# Patient Record
Sex: Male | Born: 1945
Health system: Southern US, Community
[De-identification: ages and names within clinical notes are randomized; demographics above are authoritative.]

## PROBLEM LIST (undated history)

## (undated) DIAGNOSIS — K219 Gastro-esophageal reflux disease without esophagitis: Secondary | ICD-10-CM

## (undated) DIAGNOSIS — Z860100 Personal history of colon polyps, unspecified: Secondary | ICD-10-CM

## (undated) DIAGNOSIS — E785 Hyperlipidemia, unspecified: Secondary | ICD-10-CM

## (undated) DIAGNOSIS — I10 Essential (primary) hypertension: Secondary | ICD-10-CM

## (undated) DIAGNOSIS — A048 Other specified bacterial intestinal infections: Secondary | ICD-10-CM

## (undated) HISTORY — DX: Other specified bacterial intestinal infections: A04.8

## (undated) HISTORY — PX: ROTATOR CUFF REPAIR: SHX139

## (undated) HISTORY — PX: COLONOSCOPY: SHX174

## (undated) HISTORY — DX: Personal history of colon polyps, unspecified: Z86.0100

## (undated) HISTORY — DX: Personal history of colonic polyps: Z86.010

## (undated) HISTORY — DX: Hyperlipidemia, unspecified: E78.5

---

## 2002-06-15 ENCOUNTER — Ambulatory Visit (HOSPITAL_BASED_OUTPATIENT_CLINIC_OR_DEPARTMENT_OTHER): Admission: RE | Admit: 2002-06-15 | Discharge: 2002-06-16 | Payer: Self-pay | Admitting: Orthopedic Surgery

## 2003-11-29 DIAGNOSIS — Z860101 Personal history of adenomatous and serrated colon polyps: Secondary | ICD-10-CM | POA: Insufficient documentation

## 2004-02-21 ENCOUNTER — Emergency Department (HOSPITAL_COMMUNITY): Admission: EM | Admit: 2004-02-21 | Discharge: 2004-02-21 | Payer: Self-pay | Admitting: Emergency Medicine

## 2004-10-28 ENCOUNTER — Ambulatory Visit: Payer: Self-pay | Admitting: Internal Medicine

## 2005-06-02 ENCOUNTER — Ambulatory Visit: Payer: Self-pay | Admitting: Internal Medicine

## 2008-04-04 ENCOUNTER — Emergency Department (HOSPITAL_COMMUNITY): Admission: EM | Admit: 2008-04-04 | Discharge: 2008-04-04 | Payer: Self-pay | Admitting: Emergency Medicine

## 2008-11-24 ENCOUNTER — Ambulatory Visit: Payer: Self-pay | Admitting: Gastroenterology

## 2008-12-06 ENCOUNTER — Ambulatory Visit: Payer: Self-pay | Admitting: Gastroenterology

## 2008-12-21 ENCOUNTER — Ambulatory Visit: Payer: Self-pay | Admitting: Internal Medicine

## 2008-12-21 DIAGNOSIS — E785 Hyperlipidemia, unspecified: Secondary | ICD-10-CM

## 2008-12-21 HISTORY — DX: Hyperlipidemia, unspecified: E78.5

## 2008-12-21 LAB — CONVERTED CEMR LAB
Basophils Relative: 0.7 % (ref 0.0–3.0)
Chloride: 105 meq/L (ref 96–112)
Creatinine, Ser: 1 mg/dL (ref 0.4–1.5)
Direct LDL: 181 mg/dL
Eosinophils Relative: 4.2 % (ref 0.0–5.0)
HCT: 47.9 % (ref 39.0–52.0)
Hemoglobin: 16.7 g/dL (ref 13.0–17.0)
MCV: 86.2 fL (ref 78.0–100.0)
Monocytes Absolute: 0.6 10*3/uL (ref 0.1–1.0)
Neutro Abs: 4.1 10*3/uL (ref 1.4–7.7)
Neutrophils Relative %: 66 % (ref 43.0–77.0)
Potassium: 4.2 meq/L (ref 3.5–5.1)
RBC: 5.55 M/uL (ref 4.22–5.81)
Sodium: 140 meq/L (ref 135–145)
Total CHOL/HDL Ratio: 5
VLDL: 18.2 mg/dL (ref 0.0–40.0)
WBC: 6.3 10*3/uL (ref 4.5–10.5)

## 2010-12-13 NOTE — Op Note (Signed)
NAME:  Bradley Thompson, Bradley Thompson                           ACCOUNT NO.:  [id number]   MEDICAL RECORD NO.:  [id number]                   PATIENT TYPE:  AMB   LOCATION:  DSC                                  FACILITY:  [facility]   PHYSICIAN:  Harvie Junior, M.D.                DATE OF BIRTH:  Oct 23, 1945   DATE OF PROCEDURE:  06/15/2002  DATE OF DISCHARGE:                                 OPERATIVE REPORT   PREOPERATIVE DIAGNOSIS:  1. Rotator cuff tear.  2. Impingement.  3. Acromioclavicular joint arthritis.   POSTOPERATIVE DIAGNOSIS:  1. Rotator cuff tear.  2. Impingement.  3. Acromioclavicular joint arthritis.  4. Severely injured biceps tendon with superior labral fray and tear.   OPERATION PERFORMED:  1. Mini open rotator cuff repair with chronic rotator cuff tear with     corresponding acromioplasty.  2. Distal clavicle resection, arthroscopic.  3. Bicipital tenodesis in the biceps groove with suture anchors.   SURGEON:  Harvie Junior, M.D.   ASSISTANT:  Marshia Ly, P.A.   ANESTHESIA:  General.   INDICATIONS FOR PROCEDURE:  The patient is a 65 year old gentleman with a  long history of having a fall at work.  He began complaining of shoulder  pain and weakness.  Because of continued complaints of pain and weakness, he  was seen and noted to have significant weakness.  MRI was obtained which  showed that he had a full thickness retracted rotator cuff tear with some  fraying of the biceps tendon.  He is brought to the operating room for  fixation of rotator cuff tear.   DESCRIPTION OF PROCEDURE:  The patient was brought to the operating room and  after adequate anesthesia was obtained with a general anesthetic, the  patient was placed in beach chair position.  All bony prominences were well  padded.  Attention was turned to the left shoulder where after routine  prepping and draping, arthroscopic examination of the shoulder revealed that  there was significant fray of the biceps  tendon with longitudinal split  tearing and narrowing.  This was debrided within the glenohumeral joint and  the rotator cuff was then identified.  The rotator tear was identified and  noted to be really not retracted all that significantly.  We then debrided  the superior labral tear, debrided the torn portions of the biceps tendon  and then went up into the subacromial space.  At that point, attention was  turned towards the large anterior spur which was debrided from the posterior  portal as well as from the lateral portal.  The distal clavicle was resected  over a span of about 17 mm.  At this point attention was turned to mini open  rotator cuff repair.  The lateral incision was made.  Subcutaneous tissues  dissected down to the level of the raphe between the anterior and middle  deltoid.  This  was explored.  This was opened and the rotator cuff tear was  identified.  The bicipital notch was very stenotic and with sharp spurs in  the notch and at this point it was felt that the biceps tendon because of  its fraying, a longitudinal split tearing was at high risk for failing.  At  this point the biceps tendon was released from the superior portion, contact  portion of the glenoid and the biceps tendon was tagged in the wound.  Attention now returned to creating a trough just right on lateral to the  articular surface and a repair was undertaken with four suture anchors.  One  was in the subscap, two in the supraspinatus and one in the infraspinatus  although the infraspinatus did have a tear in a shape such that tendon could  be repaired to tendon.  The biceps tendon had been released previously and  was also tenodesed through a suture anchor which was used also for rotator  cuff repair and a separate suture anchor which was in the bicipital groove  which had been debrided of all bony prominences and spurring.  Once the  anchoring was achieved and all knots were tied, the arm was put  through a  range of motion.  Excellent range of motion was achieved.  A check was made  of the acromioplasty and excellent acromioplasty had been achieved.  Distal  clavicle resection also.  At this point the shoulder was copiously irrigated  and suctioned dry.  The raphe was closed with one Vicryl running suture and  the skin with 2-0 Vicryl and a 3-0 Maxon pullout suture.  Benzoin and Steri-  Strips were applied.  The patient was taken to the recovery room where she  was noted to be in satisfactory condition.  The estimated blood loss for  this procedure was none.                                                 Harvie Junior, M.D.    Ranae Plumber  D:  06/15/2002  T:  06/15/2002  Job:  [id number]

## 2011-02-26 ENCOUNTER — Encounter: Payer: Self-pay | Admitting: Internal Medicine

## 2011-02-26 ENCOUNTER — Ambulatory Visit (INDEPENDENT_AMBULATORY_CARE_PROVIDER_SITE_OTHER): Payer: BC Managed Care – PPO | Admitting: Internal Medicine

## 2011-02-26 VITALS — BP 114/80 | HR 68 | Temp 98.9°F | Ht 68.0 in | Wt 183.5 lb

## 2011-02-26 DIAGNOSIS — N529 Male erectile dysfunction, unspecified: Secondary | ICD-10-CM

## 2011-02-26 DIAGNOSIS — Z0000 Encounter for general adult medical examination without abnormal findings: Secondary | ICD-10-CM | POA: Insufficient documentation

## 2011-02-26 DIAGNOSIS — R42 Dizziness and giddiness: Secondary | ICD-10-CM

## 2011-02-26 DIAGNOSIS — M6281 Muscle weakness (generalized): Secondary | ICD-10-CM

## 2011-02-26 DIAGNOSIS — L723 Sebaceous cyst: Secondary | ICD-10-CM

## 2011-02-26 DIAGNOSIS — R29898 Other symptoms and signs involving the musculoskeletal system: Secondary | ICD-10-CM

## 2011-02-26 MED ORDER — VARDENAFIL HCL 20 MG PO TABS: 20.0000 mg | ORAL_TABLET | ORAL | Status: AC | PRN

## 2011-02-26 MED ORDER — MECLIZINE HCL 12.5 MG PO TABS: 12.5000 mg | ORAL_TABLET | Freq: Three times a day (TID) | ORAL | Status: AC | PRN

## 2011-02-26 NOTE — Patient Instructions (Signed)
Take all new medications as prescribed Continue all other medications as before You can also take  Mucinex (or it's generic off brand) for congestion  

## 2011-02-26 NOTE — Progress Notes (Signed)
  Subjective:    Patient ID: Bradley Thompson, male    DOB: 08-14-1945, 65 y.o.   MRN: [id number]  HPI here to f/u with acute;  Mentions 2 wks ago was "partying" with ETOH rather large amount over the weekend, but Sunday morning could not get up due to room spinning, worse with position change.  Better initially, but since then has had milder symptoms recurrent with position change, such that he finds it easier to sleep on left side.  No overt HA, ear symtpoms, sinus symtpoms, ST, cough, fever and Pt denies chest pain, increased sob or doe, wheezing, orthopnea, PND, increased LE swelling, palpitations, dizziness or syncope.  Pt denies new neurological symptoms such as new headache, or facial or extremity weakness or numbness, except does mention chronic recurrent approx 1 yr weakness to the LLE only worse with standing for approx 15 min, no pain, no numbness, no back pain. Also with "bump on the back" , chronic peristent, Dr Debby Bud stated no tx needed but pt wants looked at again.   Past Medical History  Diagnosis Date  . HYPERLIPIDEMIA 12/21/2008   Past Surgical History  Procedure Date  . Rotator cuff repair     Dr. Luiz Blare    reports that he has been passively smoking.  He does not have any smokeless tobacco history on file. His alcohol and drug histories not on file. family history is not on file. No Known Allergies No current outpatient prescriptions on file prior to visit.   Review of Systems Review of Systems  Constitutional: Negative for diaphoresis and unexpected weight change.  HENT: Negative for drooling and tinnitus.   Eyes: Negative for photophobia and visual disturbance.  Respiratory: Negative for choking and stridor.   Gastrointestinal: Negative for vomiting and blood in stool.  Genitourinary: Negative for hematuria and decreased urine volume.  Musculoskeletal: Negative for gait problem.  Skin: Negative for color change and wound.  Neurological: Negative for tremors and numbness.    Psychiatric/Behavioral: Negative for decreased concentration. The patient is not hyperactive.       Objective:   Physical Exam BP 114/80  Pulse 68  Temp(Src) 98.9 F (37.2 C) (Oral)  Ht 5\' 8"  (1.727 m)  Wt 183 lb 8 oz (83.235 kg)  BMI 27.90 kg/m2  SpO2 97% Physical Exam  VS noted Constitutional: Pt appears well-developed and well-nourished.  HENT: Head: Normocephalic.  Right Ear: External ear normal.  Left Ear: External ear normal.  Bilat tm's mild erythema.  Sinus nontender.  Pharynx mild erythema Eyes: Conjunctivae and EOM are normal. Pupils are equal, round, and reactive to light.  Neck: Normal range of motion. Neck supple.  Cardiovascular: Normal rate and regular rhythm.   Pulmonary/Chest: Effort normal and breath sounds normal.  Abd:  Soft, NT, non-distended, + BS Neurological: Pt is alert. No cranial nerve deficit. FTN intact, motor/dtr/gait intact Skin: Skin is warm. No erythema. right flank with 1/2 cm noninflamed seb cyst subq Psychiatric: Pt behavior is normal. Thought content normal.         Assessment & Plan:

## 2011-03-06 ENCOUNTER — Encounter: Payer: Self-pay | Admitting: Internal Medicine

## 2011-03-06 ENCOUNTER — Ambulatory Visit (INDEPENDENT_AMBULATORY_CARE_PROVIDER_SITE_OTHER): Payer: BC Managed Care – PPO | Admitting: Internal Medicine

## 2011-03-06 VITALS — BP 138/94 | HR 84 | Temp 98.6°F | Wt 182.0 lb

## 2011-03-06 DIAGNOSIS — Z0000 Encounter for general adult medical examination without abnormal findings: Secondary | ICD-10-CM

## 2011-03-06 DIAGNOSIS — R42 Dizziness and giddiness: Secondary | ICD-10-CM

## 2011-03-06 DIAGNOSIS — E785 Hyperlipidemia, unspecified: Secondary | ICD-10-CM

## 2011-03-06 DIAGNOSIS — Z136 Encounter for screening for cardiovascular disorders: Secondary | ICD-10-CM

## 2011-03-09 ENCOUNTER — Encounter: Payer: Self-pay | Admitting: Internal Medicine

## 2011-03-09 NOTE — Assessment & Plan Note (Signed)
Dr. Jonny Ruiz did a full exam on visit August 1. Lipid labs will be ordered. Patient is current with colorectal cancer screening with last sutdy May '10. Immunizations: last tetanus May '10. 12 lead EKG without signs of ischemia.  In summary- a very nice man who seems to be medically stable. He will be notified of results of lipid panel. He will return as needed.

## 2011-03-09 NOTE — Assessment & Plan Note (Signed)
Patinet with symptoms that are strongly suggestive of labyrinthitis. He is alsready doing better. No evidence of CNS origin.  Plan - time           Meclizine as needed.

## 2011-03-09 NOTE — Assessment & Plan Note (Signed)
NO recent lab work re: lipids.  Plan - lipid panel in the near future

## 2011-03-09 NOTE — Assessment & Plan Note (Signed)
Exam benign, ok to follow, consider MRI ls spine

## 2011-03-09 NOTE — Assessment & Plan Note (Signed)
For levitra prn,  to f/u any worsening symptoms or concerns  

## 2011-03-09 NOTE — Assessment & Plan Note (Signed)
Mild, for antivert prn,  to f/u any worsening symptoms or concerns

## 2011-03-09 NOTE — Progress Notes (Signed)
  Subjective:    Patient ID: Bradley Thompson, male    DOB: 01/01/1946, 65 y.o.   MRN: [id number]  HPI Mr. Justun has not been seen for several years. He recently saw Dr. Jonny Ruiz for dizziness. This had an abrpt on-set and he thought it may be associated with drinking. He had a full exam by Dr. Jonny Ruiz - note reviewed. Today he is doing better but still with minor dizziness.  Past Medical History  Diagnosis Date  . HYPERLIPIDEMIA 12/21/2008   Past Surgical History  Procedure Date  . Rotator cuff repair     Dr. Luiz Blare   No family history on file. History   Social History  . Marital Status: Widowed    Spouse Name: N/A    Number of Children: 2  . Years of Education: N/A   Occupational History  . retired 07, [employer]    Social History Main Topics  . Smoking status: Passive Smoker  . Smokeless tobacco: Never Used  . Alcohol Use: 1.0 oz/week    2 drink(s) per week  . Drug Use: Yes     thc weekends  . Sexually Active: Yes -- Male partner(s)   Other Topics Concern  . Not on file   Social History Narrative   HSG, [employer] - 3 yrs. . married '68 - 20 yrs/widowed; serial monogomy. 2 sons - '72, '82:  Oldest son taught in [place] for several years. He lives [place]; younger son lives in [place]; 1 grandson.. retired '07 - worked [employer]. Remains sexually active - uses barrier protection.       Review of Systems System review is negative for any constitutional, cardiac, pulmonary, GI or neuro symptoms or complaints     Objective:   Physical Exam Vitals reveiwed Gen'l - WNWD AA man in no distress HEENT - normal \\Cor  - 2+ radial, RRR Pul - normal respirations. Neuro - A&O x 3, CN II-XII normal, gait - no ataxia, nl tandem gait.          Assessment & Plan:

## 2011-03-09 NOTE — Assessment & Plan Note (Signed)
D/w pt,  Not inflamed, benign, ok to follow

## 2011-03-11 ENCOUNTER — Other Ambulatory Visit: Payer: Self-pay | Admitting: *Deleted

## 2011-03-11 DIAGNOSIS — E785 Hyperlipidemia, unspecified: Secondary | ICD-10-CM

## 2011-04-30 LAB — RPR: RPR Ser Ql: NONREACTIVE

## 2011-04-30 LAB — GC/CHLAMYDIA PROBE AMP, GENITAL: Chlamydia, DNA Probe: NEGATIVE

## 2013-12-06 ENCOUNTER — Encounter: Payer: Self-pay | Admitting: Internal Medicine

## 2014-02-21 ENCOUNTER — Encounter: Payer: Self-pay | Admitting: Internal Medicine

## 2014-03-16 ENCOUNTER — Ambulatory Visit (INDEPENDENT_AMBULATORY_CARE_PROVIDER_SITE_OTHER): Payer: Medicare Other | Admitting: Family Medicine

## 2014-03-16 ENCOUNTER — Encounter: Payer: Self-pay | Admitting: Family Medicine

## 2014-03-16 VITALS — BP 122/84 | HR 82 | Ht 68.0 in | Wt 174.0 lb

## 2014-03-16 DIAGNOSIS — S86819A Strain of other muscle(s) and tendon(s) at lower leg level, unspecified leg, initial encounter: Secondary | ICD-10-CM | POA: Insufficient documentation

## 2014-03-16 DIAGNOSIS — S86812A Strain of other muscle(s) and tendon(s) at lower leg level, left leg, initial encounter: Secondary | ICD-10-CM

## 2014-03-16 DIAGNOSIS — D179 Benign lipomatous neoplasm, unspecified: Secondary | ICD-10-CM

## 2014-03-16 DIAGNOSIS — S838X9A Sprain of other specified parts of unspecified knee, initial encounter: Secondary | ICD-10-CM

## 2014-03-16 NOTE — Progress Notes (Signed)
  [facility] [place],  [address] Phone: [phone] Subjective:     CC: Knots on back  BSW:[id number] Bradley Thompson is a 68 y.o. male coming in with complaint of knots on his back. Patient states that he started over the course years but seems to be getting a little bit larger. Patient states that they're nontender and they are freely movable. Patient states that only when people Hodgkin disease Notice. Patient states that he sleeps comfortably at night it does not wake up from any pressure. Denies any radiation of pain or any numbness. Denies any fevers or chills or any abnormal weight loss. Patient also states that he has left calf pain with walking. This is an insidious onset as well with no true injury. Hurts more on the lateral aspect and after walking for a long amount of time. Denies any radiation of pain, any numbness or weakness. Patient states this has not stopped any of his activities in brace the pain is 4/10.     Past medical history, social, surgical and family history all reviewed in electronic medical record.   Review of Systems: No headache, visual changes, nausea, vomiting, diarrhea, constipation, dizziness, abdominal pain, skin rash, fevers, chills, night sweats, weight loss, swollen lymph nodes, body aches, joint swelling, muscle aches, chest pain, shortness of breath, mood changes.   Objective Blood pressure 122/84, pulse 82, height 5\' 8"  (1.727 m), weight 174 lb (78.926 kg), SpO2 97.00%.  General: No apparent distress alert and oriented x3 mood and affect normal, dressed appropriately.  HEENT: Pupils equal, extraocular movements intact  Respiratory: Patient's speak in full sentences and does not appear short of breath  Cardiovascular: No lower extremity edema, non tender, no erythema  Skin: Warm dry intact with no signs of infection or rash on extremities or on axial skeleton.  Abdomen: Soft nontender  Neuro: Cranial  nerves II through XII are intact, neurovascularly intact in all extremities with 2+ DTRs and 2+ pulses.  Lymph: No lymphadenopathy of posterior or anterior cervical chain or axillae bilaterally.  Gait normal with good balance and coordination.  MSK:  Non tender with full range of motion and good stability and symmetric strength and tone of shoulders, elbows, wrist, hip, knee and ankles bilaterally.  Back exam shows the patient does have 2 lipoma one approximately T7 on the right side as well as one at approximately L1 on the right side. These are both benign and freely movable. Nontender on exam. No skin breakdown noted. No signs of infection.  Limited musculoskeletal ultrasound of the notch shows that this is fatty tissue that is noted. No increasing Doppler flow noted.  Impression: Likely lipoma versus sebaceous cyst with no vascularization    Impression and Recommendations:     This case required medical decision making of moderate complexity.

## 2014-03-16 NOTE — Assessment & Plan Note (Signed)
Patient does have a very mild calf strain. Patient was given home exercise program, and discussed compression sleeve and as well as icing. Patient will try these interventions and come back and see me again in 3-4 weeks if not completely resolved

## 2014-03-16 NOTE — Patient Instructions (Signed)
Good to meet you You do have 2 lipomas on your back.  For your calf try the exercises Consider compression sleeves on the calfs with walking.  Come back again if you need me.  Lipoma A lipoma is a noncancerous (benign) tumor composed of fat cells. They are usually found under the skin (subcutaneous). A lipoma may occur in any tissue of the body that contains fat. Common areas for lipomas to appear include the back, shoulders, buttocks, and thighs. Lipomas are a very common soft tissue growth. They are soft and grow slowly. Most problems caused by a lipoma depend on where it is growing. DIAGNOSIS  A lipoma can be diagnosed with a physical exam. These tumors rarely become cancerous, but radiographic studies can help determine this for certain. Studies used may include:  Computerized X-ray scans (CT or CAT scan).  Computerized magnetic scans (MRI). TREATMENT  Small lipomas that are not causing problems may be watched. If a lipoma continues to enlarge or causes problems, removal is often the best treatment. Lipomas can also be removed to improve appearance. Surgery is done to remove the fatty cells and the surrounding capsule. Most often, this is done with medicine that numbs the area (local anesthetic). The removed tissue is examined under a microscope to make sure it is not cancerous. Keep all follow-up appointments with your caregiver. SEEK MEDICAL CARE IF:   The lipoma becomes larger or hard.  The lipoma becomes painful, red, or increasingly swollen. These could be signs of infection or a more serious condition. Document Released: 07/04/2002 Document Revised: 10/06/2011 Document Reviewed: 12/14/2009 [facility] Patient Information 2015 [place], [place]. This information is not intended to replace advice given to you by your health care provider. Make sure you discuss any questions you have with your health care provider.

## 2014-03-16 NOTE — Assessment & Plan Note (Signed)
We discussed with patient and her lipoma is likely the most differential. Could also be a sebaceous cyst. This is nontender and patient has no systemic findings to make this of any concern. Discuss the patient to have a clear diagnosis he would need to have biopsy which patient declined. Patient will continue to monitor closely in followup if any changes occur.

## 2014-04-18 ENCOUNTER — Ambulatory Visit (AMBULATORY_SURGERY_CENTER): Payer: Self-pay

## 2014-04-18 VITALS — Ht 67.0 in | Wt 177.4 lb

## 2014-04-18 DIAGNOSIS — Z86010 Personal history of colonic polyps: Secondary | ICD-10-CM

## 2014-04-18 NOTE — Progress Notes (Signed)
Per pt, no allergies to soy or egg products.Pt not taking any weight loss meds or using  O2 at home. 

## 2014-04-24 ENCOUNTER — Encounter: Payer: Self-pay | Admitting: Internal Medicine

## 2014-05-02 ENCOUNTER — Ambulatory Visit (AMBULATORY_SURGERY_CENTER): Payer: Medicare Other | Admitting: Internal Medicine

## 2014-05-02 ENCOUNTER — Encounter: Payer: Self-pay | Admitting: Internal Medicine

## 2014-05-02 VITALS — BP 106/67 | HR 56 | Temp 97.0°F | Resp 20 | Ht 67.0 in | Wt 177.0 lb

## 2014-05-02 DIAGNOSIS — D129 Benign neoplasm of anus and anal canal: Secondary | ICD-10-CM

## 2014-05-02 DIAGNOSIS — D120 Benign neoplasm of cecum: Secondary | ICD-10-CM

## 2014-05-02 DIAGNOSIS — D128 Benign neoplasm of rectum: Secondary | ICD-10-CM

## 2014-05-02 DIAGNOSIS — D125 Benign neoplasm of sigmoid colon: Secondary | ICD-10-CM

## 2014-05-02 DIAGNOSIS — D123 Benign neoplasm of transverse colon: Secondary | ICD-10-CM

## 2014-05-02 DIAGNOSIS — Z86010 Personal history of colonic polyps: Secondary | ICD-10-CM

## 2014-05-02 MED ORDER — SODIUM CHLORIDE 0.9 % IV SOLN: 500.0000 mL | INTRAVENOUS | Status: DC

## 2014-05-02 NOTE — Progress Notes (Signed)
Procedure ends, to recovery, report given and VSS. 

## 2014-05-02 NOTE — Progress Notes (Signed)
Called to room to assist during endoscopic procedure.  Patient ID and intended procedure confirmed with present staff. Received instructions for my participation in the procedure from the performing physician.  

## 2014-05-02 NOTE — Patient Instructions (Addendum)
I found and removed 5 polyps today - all look benign.  I will let you know pathology results and when to have another routine colonoscopy by mail.  I appreciate the opportunity to care for you. Gatha Mayer, MD, FACG YOU HAD AN ENDOSCOPIC PROCEDURE TODAY AT [facility]: Refer to the procedure report that was given to you for any specific questions about what was found during the examination.  If the procedure report does not answer your questions, please call your gastroenterologist to clarify.  If you requested that your care partner not be given the details of your procedure findings, then the procedure report has been included in a sealed envelope for you to review at your convenience later.  YOU SHOULD EXPECT: Some feelings of bloating in the abdomen. Passage of more gas than usual.  Walking can help get rid of the air that was put into your GI tract during the procedure and reduce the bloating. If you had a lower endoscopy (such as a colonoscopy or flexible sigmoidoscopy) you may notice spotting of blood in your stool or on the toilet paper. If you underwent a bowel prep for your procedure, then you may not have a normal bowel movement for a few days.  DIET: Your first meal following the procedure should be a light meal and then it is ok to progress to your normal diet.  A half-sandwich or bowl of soup is an example of a good first meal.  Heavy or fried foods are harder to digest and may make you feel nauseous or bloated.  Likewise meals heavy in dairy and vegetables can cause extra gas to form and this can also increase the bloating.  Drink plenty of fluids but you should avoid alcoholic beverages for 24 hours.  ACTIVITY: Your care partner should take you home directly after the procedure.  You should plan to take it easy, moving slowly for the rest of the day.  You can resume normal activity the day after the procedure however you should NOT DRIVE or use heavy  machinery for 24 hours (because of the sedation medicines used during the test).    SYMPTOMS TO REPORT IMMEDIATELY: A gastroenterologist can be reached at any hour.  During normal business hours, 8:30 AM to 5:00 PM Monday through Friday, call [phone].  After hours and on weekends, please call the GI answering service at [phone] who will take a message and have the physician on call contact you.   Following lower endoscopy (colonoscopy or flexible sigmoidoscopy):  Excessive amounts of blood in the stool  Significant tenderness or worsening of abdominal pains  Swelling of the abdomen that is new, acute  Fever of 100F or higher  FOLLOW UP: If any biopsies were taken you will be contacted by phone or by letter within the next 1-3 weeks.  Call your gastroenterologist if you have not heard about the biopsies in 3 weeks.  Our staff will call the home number listed on your records the next business day following your procedure to check on you and address any questions or concerns that you may have at that time regarding the information given to you following your procedure. This is a courtesy call and so if there is no answer at the home number and we have not heard from you through the emergency physician on call, we will assume that you have returned to your regular daily activities without incident.  SIGNATURES/CONFIDENTIALITY: You and/or your care partner  have signed paperwork which will be entered into your electronic medical record.  These signatures attest to the fact that that the information above on your After Visit Summary has been reviewed and is understood.  Full responsibility of the confidentiality of this discharge information lies with you and/or your care-partner.  Recommendations No aspirin, aspirin products, or NSAIDS for 2 weeks.  Next colonoscopy determined by pathology results. Polyp handout/education material provided to patient.

## 2014-05-02 NOTE — Op Note (Signed)
[place]  [address] [place], [address]   COLONOSCOPY PROCEDURE REPORT  PATIENT: Bradley, Thompson  MR#: [id number] BIRTHDATE: August 29, 1945 , 28  yrs. old GENDER: male ENDOSCOPIST: Gatha Mayer, MD, [facility] PROCEDURE DATE:  05/02/2014 PROCEDURE:   Colonoscopy with cold biopsy polypectomy and Colonoscopy with snare polypectomy First Screening Colonoscopy - Avg.  risk and is 50 yrs.  old or older - No.  Prior Negative Screening - Now for repeat screening. N/A ASA CLASS:   Class II INDICATIONS:surveillance colonoscopy based on a history of adenomatous colonic polyp(s). MEDICATIONS: Propofol 130 mg IV  DESCRIPTION OF PROCEDURE:   After the risks benefits and alternatives of the procedure were thoroughly explained, informed consent was obtained.  The digital rectal exam revealed no abnormalities of the rectum.   The LB [id number]  endoscope was introduced through the anus and advanced to the cecum, which was identified by both the appendix and ileocecal valve. No adverse events experienced.   The quality of the prep was excellent, using MiraLax  The instrument was then slowly withdrawn as the colon was fully examined.      COLON FINDINGS: Two sessile polyps ranging from 2 to 24mm in size were found in the transverse colon and at the cecum.  A polypectomy was performed.  The resection was complete, the polyp tissue was completely retrieved and sent to histology.   Three sessile polyps ranging from 4 to 69mm in size were found in the rectum and sigmoid colon.  Polypectomies were performed using snare cautery and with a cold snare.  The resection was complete, the polyp tissue was completely retrieved and sent to histology.   The examination was otherwise normal.   Right colon retroflexion included.  Retroflexed views revealed no abnormalities. The time to cecum=3 minutes 28 seconds.  Withdrawal time=13 minutes 44 seconds.  The scope was withdrawn and the  procedure completed. COMPLICATIONS: There were no immediate complications.    ENDOSCOPIC IMPRESSION: 1.   Two sessile polyps ranging from 2 to 31mm in size were found in the transverse colon and at the cecum; polypectomy was performed 2.   Three sessile polyps ranging from 4 to 39mm in size were found in the rectum and sigmoid colon; polypectomies were performed using snare cautery and with a cold snare 3.   The examination was otherwise normal - hx adenomas since 2005  RECOMMENDATIONS: 1.  Hold Aspirin and all other NSAIDS for 2 weeks. 2.  Timing of repeat colonoscopy will be determined by pathology findings.  eSigned:  Gatha Mayer, MD, [facility] 05/02/2014 9:40 AM   cc: Jannette Fogo, MD and The Patient   PATIENT NAME:  Bradley, Thompson MR#: [id number]

## 2014-05-03 ENCOUNTER — Telehealth: Payer: Self-pay

## 2014-05-03 NOTE — Telephone Encounter (Signed)
Left a message at [phone] for the pt to call us back if he has any questions or concerns. maw

## 2014-05-10 ENCOUNTER — Encounter: Payer: Self-pay | Admitting: Internal Medicine

## 2014-05-10 NOTE — Progress Notes (Signed)
Quick Note:  4 adenomas max 10 mm Repeat colon 2018 ______

## 2014-06-09 ENCOUNTER — Other Ambulatory Visit (INDEPENDENT_AMBULATORY_CARE_PROVIDER_SITE_OTHER): Payer: Medicare Other

## 2014-06-09 ENCOUNTER — Ambulatory Visit (INDEPENDENT_AMBULATORY_CARE_PROVIDER_SITE_OTHER): Payer: Medicare Other | Admitting: Internal Medicine

## 2014-06-09 ENCOUNTER — Encounter: Payer: Self-pay | Admitting: Internal Medicine

## 2014-06-09 VITALS — BP 138/84 | HR 65 | Temp 98.5°F | Resp 18 | Ht 68.0 in | Wt 176.8 lb

## 2014-06-09 DIAGNOSIS — E785 Hyperlipidemia, unspecified: Secondary | ICD-10-CM

## 2014-06-09 DIAGNOSIS — I10 Essential (primary) hypertension: Secondary | ICD-10-CM

## 2014-06-09 DIAGNOSIS — D179 Benign lipomatous neoplasm, unspecified: Secondary | ICD-10-CM

## 2014-06-09 DIAGNOSIS — Z0000 Encounter for general adult medical examination without abnormal findings: Secondary | ICD-10-CM

## 2014-06-09 LAB — LIPID PANEL
CHOL/HDL RATIO: 5
Cholesterol: 251 mg/dL — ABNORMAL HIGH (ref 0–200)
HDL: 48.6 mg/dL (ref >39.00)
LDL CALC: 182 mg/dL — AB (ref 0–99)
NONHDL: 202.4
TRIGLYCERIDES: 102 mg/dL (ref 0.0–149.0)
VLDL: 20.4 mg/dL (ref 0.0–40.0)

## 2014-06-09 LAB — BASIC METABOLIC PANEL
BUN: 10 mg/dL (ref 6–23)
CHLORIDE: 104 meq/L (ref 96–112)
CO2: 21 mEq/L (ref 19–32)
Calcium: 9.4 mg/dL (ref 8.4–10.5)
Creatinine, Ser: 1 mg/dL (ref 0.4–1.5)
GFR: 99.25 mL/min (ref >60.00)
Glucose, Bld: 96 mg/dL (ref 70–99)
Potassium: 3.8 mEq/L (ref 3.5–5.1)
Sodium: 138 mEq/L (ref 135–145)

## 2014-06-09 NOTE — Assessment & Plan Note (Signed)
2 lipomas on the back. We'll continue to monitor yearly for growth. Benign in appearance.

## 2014-06-09 NOTE — Progress Notes (Signed)
   Subjective:    Patient ID: Bradley Thompson, male    DOB: Jan 24, 1946, 68 y.o.   MRN: [id number]  HPI The patient is a 68 year old man who comes in today to establish care. His past medical history of high cholesterol. He is taking supplements and no medications for the cholesterol right now. He tries to exercise several times weekly however now his family is in town and they're keeping him very busy is not been able to do his regular exercise. He denies any chest pains, shortness of breath, abdominal pain, acid reflux, joint pain, recent falls, dizziness.  Here for medicare wellness  Diet: heart healthy or DM if diabetic Physical activity: sedentary Depression/mood screen: negative Hearing: intact to whispered voice Visual acuity: grossly normal, performs annual eye exam  ADLs: capable Fall risk: none Home safety: good Cognitive evaluation: intact to orientation, naming, recall and repetition EOL planning: adv directives, full code/ I agree  I have personally reviewed and have noted 1. The patient's medical and social history 2. Their use of alcohol, tobacco or illicit drugs 3. Their current medications and supplements 4. The patient's functional ability including ADL's, fall risks, home safety risks and hearing or visual impairment. 5. Diet and physical activities 6. Evidence for depression or mood disorders 7. Updated list of providers.  Review of Systems  Constitutional: Negative for fever, activity change, appetite change, fatigue and unexpected weight change.  HENT: Negative.   Respiratory: Negative for cough, chest tightness, shortness of breath and wheezing.   Cardiovascular: Negative for chest pain, palpitations and leg swelling.  Gastrointestinal: Negative for abdominal pain, diarrhea, constipation and abdominal distention.  Genitourinary: Negative.   Musculoskeletal: Negative.   Skin: Negative.   Neurological: Negative.   Psychiatric/Behavioral: Negative.         Objective:   Physical Exam  Constitutional: He is oriented to person, place, and time. He appears well-developed and well-nourished. No distress.  HENT:  Head: Normocephalic and atraumatic.  Eyes: EOM are normal.  Neck: Normal range of motion.  Cardiovascular: Normal rate and regular rhythm.   Pulmonary/Chest: Effort normal and breath sounds normal. No respiratory distress. He has no wheezes. He has no rales.  Abdominal: Soft. Bowel sounds are normal.  Musculoskeletal:  2 lipomas on his back. Benign in appearance.  Neurological: He is alert and oriented to person, place, and time. Coordination normal.  Skin: Skin is warm and dry.   Filed Vitals:   06/09/14 1303  BP: 138/84  Pulse: 65  Temp: 98.5 F (36.9 C)  TempSrc: Oral  Resp: 18  Height: 5\' 8"  (1.727 m)  Weight: 176 lb 12.8 oz (80.196 kg)  SpO2: 98%      Assessment & Plan:

## 2014-06-09 NOTE — Assessment & Plan Note (Signed)
Patient declines any kind of injections including flu shot, pneumonia shot, tetanus shot, shingles shot. He is due for repeat colonoscopy in 2018.

## 2014-06-09 NOTE — Progress Notes (Signed)
Pre visit review using our clinic review tool, if applicable. No additional management support is needed unless otherwise documented below in the visit note. 

## 2014-06-09 NOTE — Patient Instructions (Signed)
You are doing great and are very healthy.  We will check your blood work today and call you back with the results.  Common back next year for a physical. Please feel free to call our office if you have any problems or questions before your next visit.  Health Maintenance A healthy lifestyle and preventative care can promote health and wellness.  Maintain regular health, dental, and eye exams.  Eat a healthy diet. Foods like vegetables, fruits, whole grains, low-fat dairy products, and lean protein foods contain the nutrients you need and are low in calories. Decrease your intake of foods high in solid fats, added sugars, and salt. Get information about a proper diet from your health care provider, if necessary.  Regular physical exercise is one of the most important things you can do for your health. Most adults should get at least 150 minutes of moderate-intensity exercise (any activity that increases your heart rate and causes you to sweat) each week. In addition, most adults need muscle-strengthening exercises on 2 or more days a week.   Maintain a healthy weight. The body mass index (BMI) is a screening tool to identify possible weight problems. It provides an estimate of body fat based on height and weight. Your health care provider can find your BMI and can help you achieve or maintain a healthy weight. For males 20 years and older:  A BMI below 18.5 is considered underweight.  A BMI of 18.5 to 24.9 is normal.  A BMI of 25 to 29.9 is considered overweight.  A BMI of 30 and above is considered obese.  Maintain normal blood lipids and cholesterol by exercising and minimizing your intake of saturated fat. Eat a balanced diet with plenty of fruits and vegetables. Blood tests for lipids and cholesterol should begin at age 22 and be repeated every 5 years. If your lipid or cholesterol levels are high, you are over age 34, or you are at high risk for heart disease, you may need your  cholesterol levels checked more frequently.Ongoing high lipid and cholesterol levels should be treated with medicines if diet and exercise are not working.  If you smoke, find out from your health care provider how to quit. If you do not use tobacco, do not start.  Lung cancer screening is recommended for adults aged 43-80 years who are at high risk for developing lung cancer because of a history of smoking. A yearly low-dose CT scan of the lungs is recommended for people who have at least a 30-pack-year history of smoking and are current smokers or have quit within the past 15 years. A pack year of smoking is smoking an average of 1 pack of cigarettes a day for 1 year (for example, a 30-pack-year history of smoking could mean smoking 1 pack a day for 30 years or 2 packs a day for 15 years). Yearly screening should continue until the smoker has stopped smoking for at least 15 years. Yearly screening should be stopped for people who develop a health problem that would prevent them from having lung cancer treatment.  If you choose to drink alcohol, do not have more than 2 drinks per day. One drink is considered to be 12 oz (360 mL) of beer, 5 oz (150 mL) of wine, or 1.5 oz (45 mL) of liquor.  Avoid the use of street drugs. Do not share needles with anyone. Ask for help if you need support or instructions about stopping the use of drugs.  High blood  pressure causes heart disease and increases the risk of stroke. Blood pressure should be checked at least every 1-2 years. Ongoing high blood pressure should be treated with medicines if weight loss and exercise are not effective.  If you are 38-65 years old, ask your health care provider if you should take aspirin to prevent heart disease.  Diabetes screening involves taking a blood sample to check your fasting blood sugar level. This should be done once every 3 years after age 7 if you are at a normal weight and without risk factors for diabetes. Testing  should be considered at a younger age or be carried out more frequently if you are overweight and have at least 1 risk factor for diabetes.  Colorectal cancer can be detected and often prevented. Most routine colorectal cancer screening begins at the age of 20 and continues through age 77. However, your health care provider may recommend screening at an earlier age if you have risk factors for colon cancer. On a yearly basis, your health care provider may provide home test kits to check for hidden blood in the stool. A small camera at the end of a tube may be used to directly examine the colon (sigmoidoscopy or colonoscopy) to detect the earliest forms of colorectal cancer. Talk to your health care provider about this at age 74 when routine screening begins. A direct exam of the colon should be repeated every 5-10 years through age 67, unless early forms of precancerous polyps or small growths are found.  People who are at an increased risk for hepatitis B should be screened for this virus. You are considered at high risk for hepatitis B if:  You were born in a country where hepatitis B occurs often. Talk with your health care provider about which countries are considered high risk.  Your parents were born in a high-risk country and you have not received a shot to protect against hepatitis B (hepatitis B vaccine).  You have HIV or AIDS.  You use needles to inject street drugs.  You live with, or have sex with, someone who has hepatitis B.  You are a man who has sex with other men (MSM).  You get hemodialysis treatment.  You take certain medicines for conditions like cancer, organ transplantation, and autoimmune conditions.  Hepatitis C blood testing is recommended for all people born from [birth year] through 1965 and any individual with known risk factors for hepatitis C.  Healthy men should no longer receive prostate-specific antigen (PSA) blood tests as part of routine cancer screening. Talk to  your health care provider about prostate cancer screening.  Testicular cancer screening is not recommended for adolescents or adult males who have no symptoms. Screening includes self-exam, a health care provider exam, and other screening tests. Consult with your health care provider about any symptoms you have or any concerns you have about testicular cancer.  Practice safe sex. Use condoms and avoid high-risk sexual practices to reduce the spread of sexually transmitted infections (STIs).  You should be screened for STIs, including gonorrhea and chlamydia if:  You are sexually active and are younger than 24 years.  You are older than 24 years, and your health care provider tells you that you are at risk for this type of infection.  Your sexual activity has changed since you were last screened, and you are at an increased risk for chlamydia or gonorrhea. Ask your health care provider if you are at risk.  If you are at risk  of being infected with HIV, it is recommended that you take a prescription medicine daily to prevent HIV infection. This is called pre-exposure prophylaxis (PrEP). You are considered at risk if:  You are a man who has sex with other men (MSM).  You are a heterosexual man who is sexually active with multiple partners.  You take drugs by injection.  You are sexually active with a partner who has HIV.  Talk with your health care provider about whether you are at high risk of being infected with HIV. If you choose to begin PrEP, you should first be tested for HIV. You should then be tested every 3 months for as long as you are taking PrEP.  Use sunscreen. Apply sunscreen liberally and repeatedly throughout the day. You should seek shade when your shadow is shorter than you. Protect yourself by wearing long sleeves, pants, a wide-brimmed hat, and sunglasses year round whenever you are outdoors.  Tell your health care provider of new moles or changes in moles, especially if  there is a change in shape or color. Also, tell your health care provider if a mole is larger than the size of a pencil eraser.  A one-time screening for abdominal aortic aneurysm (AAA) and surgical repair of large AAAs by ultrasound is recommended for men aged 4-75 years who are current or former smokers.  Stay current with your vaccines (immunizations). Document Released: 01/10/2008 Document Revised: 07/19/2013 Document Reviewed: 12/09/2010 [facility] Patient Information 2015 [place], [place]. This information is not intended to replace advice given to you by your health care provider. Make sure you discuss any questions you have with your health care provider.

## 2014-06-09 NOTE — Assessment & Plan Note (Signed)
Patient has needed cluster medication the past, stopped medication on his own. He is taking oral supplements. Check lipid panel today may need to go back on cholesterol medication.

## 2014-06-21 ENCOUNTER — Telehealth: Payer: Self-pay | Admitting: Internal Medicine

## 2014-06-21 NOTE — Telephone Encounter (Signed)
Patient calling for results of blood work

## 2014-06-26 NOTE — Telephone Encounter (Signed)
Tried to call patient no answer. 

## 2014-12-29 ENCOUNTER — Encounter: Payer: Self-pay | Admitting: Gastroenterology

## 2017-07-01 ENCOUNTER — Encounter: Payer: Self-pay | Admitting: Internal Medicine

## 2017-11-05 ENCOUNTER — Other Ambulatory Visit (INDEPENDENT_AMBULATORY_CARE_PROVIDER_SITE_OTHER): Payer: 59

## 2017-11-05 ENCOUNTER — Ambulatory Visit (INDEPENDENT_AMBULATORY_CARE_PROVIDER_SITE_OTHER): Payer: 59 | Admitting: Internal Medicine

## 2017-11-05 ENCOUNTER — Encounter: Payer: Self-pay | Admitting: Internal Medicine

## 2017-11-05 VITALS — BP 116/62 | HR 64 | Ht 68.0 in | Wt 172.6 lb

## 2017-11-05 DIAGNOSIS — Z86010 Personal history of colonic polyps: Secondary | ICD-10-CM

## 2017-11-05 DIAGNOSIS — R1013 Epigastric pain: Secondary | ICD-10-CM

## 2017-11-05 DIAGNOSIS — Z860101 Personal history of adenomatous and serrated colon polyps: Secondary | ICD-10-CM

## 2017-11-05 LAB — CBC WITH DIFFERENTIAL/PLATELET
BASOS PCT: 1.5 % (ref 0.0–3.0)
Basophils Absolute: 0.1 10*3/uL (ref 0.0–0.1)
Eosinophils Absolute: 0.2 10*3/uL (ref 0.0–0.7)
Eosinophils Relative: 2.9 % (ref 0.0–5.0)
HCT: 46.5 % (ref 39.0–52.0)
HEMOGLOBIN: 16.5 g/dL (ref 13.0–17.0)
LYMPHS ABS: 1.3 10*3/uL (ref 0.7–4.0)
Lymphocytes Relative: 21.5 % (ref 12.0–46.0)
MCHC: 35.6 g/dL (ref 30.0–36.0)
MCV: 83.5 fl (ref 78.0–100.0)
MONO ABS: 0.6 10*3/uL (ref 0.1–1.0)
Monocytes Relative: 9.2 % (ref 3.0–12.0)
Neutro Abs: 4 10*3/uL (ref 1.4–7.7)
Neutrophils Relative %: 64.9 % (ref 43.0–77.0)
Platelets: 195 10*3/uL (ref 150.0–400.0)
RBC: 5.56 Mil/uL (ref 4.22–5.81)
RDW: 14.9 % (ref 11.5–15.5)
WBC: 6.1 10*3/uL (ref 4.0–10.5)

## 2017-11-05 NOTE — Patient Instructions (Signed)
  Your provider has requested that you go to the basement level for lab work before leaving today. Press "B" on the elevator. The lab is located at the first door on the left as you exit the elevator.    We will be in touch with results and plans.    I appreciate the opportunity to care for you. Silvano Rusk, MD, [facility]

## 2017-11-05 NOTE — Progress Notes (Signed)
   Bradley Thompson 72 y.o. 1946-06-07 [id number]  Assessment & Plan:   Encounter Diagnoses  Name Primary?  . Abdominal pain, epigastric Yes  . Dyspepsia   . Hx of adenomatous colonic polyps     He may have a duodenal ulcer or gastritis.  I want to check him for H. pylori with a stool antigen.  Once I know that result will decide next step.  He definitely needs a colonoscopy for surveillance.  He may need an EGD.  He will return for a pre-visit pending the H. pylori test.  I have also checked a CBC today.  I appreciate the opportunity to care for this patient.  Gatha Mayer, MD, [facility]   Subjective:   Chief Complaint: Abdominal pain  HPI Bradley Thompson is here with a complaint of epigastric pain somewhat periumbilical off and on for the past month.  He wonders if it is a duodenal ulcer which he says he had in the past.  He is known to me from a history of colonic polyps and recall colonoscopy was due in 2018.  He has been trying Pepto-Bismol, baking soda and ginger with variable relief.  Denies anti-inflammatory or aspirin use.  He is belching a lot.  No unintentional weight loss.  Bowel habits are otherwise regular though Pepto-Bismol does make the stools black. No Known Allergies Current Meds  Medication Sig  . Black Currant Seed Oil 500 MG CAPS Take 1 capsule by mouth daily.  . Coenzyme Q10 (CO Q10) 100 MG CAPS Take by mouth daily.  . Flaxseed, Linseed, (FLAX SEEDS PO) Take by mouth daily.    . Multiple Vitamin (MULTIVITAMIN) capsule Take 1 capsule by mouth daily.    . Omega-3 Fatty Acids (OMEGA 3 PO) Take by mouth daily.    . WHEAT GERM OIL PO Take by mouth daily.     Past Medical History:  Diagnosis Date  . History of colon polyps   . HYPERLIPIDEMIA 12/21/2008   Past Surgical History:  Procedure Laterality Date  . COLONOSCOPY    . ROTATOR CUFF REPAIR     Dr. Andee Poles shoulder   Social History   Social History Narrative   HSG, [employer] - 3 yrs. . married '68 - 20  yrs/widowed; serial monogomy. 2 sons - '72, '82:  Oldest son taught in [place] for several years. He lives [place]; younger son lives in [place]; 1 grandson.. retired '07 - worked [employer]. Remains sexually active - uses barrier protection.   No family hx colon cancer   Review of Systems As per HPI Otherwise says feels well - no fatigue, no sig joint or muscle pain  Objective:   Physical Exam @BP  116/62   Pulse 64   Ht 5\' 8"  (1.727 m)   Wt 172 lb 9.6 oz (78.3 kg)   BMI 26.24 kg/m @  General:  Well-developed, well-nourished and in no acute distress Eyes:  anicteric. ENT:   Mouth and posterior pharynx free of lesions.  Does have dentures Neck:   supple w/o thyromegaly or mass.  Lungs: Clear to auscultation bilaterally. Heart:  S1S2, no rubs, murmurs, gallops. Abdomen:  soft, non-tender, no hepatosplenomegaly, hernia, or mass and BS+.  Lymph:  no cervical or supraclavicular adenopathy. Extremities:   no  cyanosis or clubbing Skin  2 small lipomas on the right mid and lower back Neuro:  A&O x 3.  Psych:  appropriate mood and  Affect.   Data Reviewed:  See HPI

## 2017-11-06 ENCOUNTER — Encounter: Payer: Self-pay | Admitting: Internal Medicine

## 2017-11-06 ENCOUNTER — Other Ambulatory Visit: Payer: 59

## 2017-11-06 DIAGNOSIS — R1013 Epigastric pain: Secondary | ICD-10-CM

## 2017-11-08 LAB — H. PYLORI ANTIGEN, STOOL: H PYLORI AG STL: POSITIVE — AB

## 2017-11-09 ENCOUNTER — Encounter: Payer: Self-pay | Admitting: Internal Medicine

## 2017-11-09 ENCOUNTER — Other Ambulatory Visit: Payer: Self-pay

## 2017-11-09 DIAGNOSIS — A048 Other specified bacterial intestinal infections: Secondary | ICD-10-CM

## 2017-11-09 HISTORY — DX: Other specified bacterial intestinal infections: A04.8

## 2017-11-09 MED ORDER — BISMUTH SUBSALICYLATE 262 MG PO CHEW: 524.0000 mg | CHEWABLE_TABLET | Freq: Four times a day (QID) | ORAL | 0 refills | Status: AC

## 2017-11-09 MED ORDER — METRONIDAZOLE 250 MG PO TABS: 250.0000 mg | ORAL_TABLET | Freq: Four times a day (QID) | ORAL | 0 refills | Status: AC

## 2017-11-09 MED ORDER — DOXYCYCLINE HYCLATE 100 MG PO CAPS: 100.0000 mg | ORAL_CAPSULE | Freq: Two times a day (BID) | ORAL | 0 refills | Status: AC

## 2017-11-09 MED ORDER — OMEPRAZOLE 20 MG PO CPDR: 20.0000 mg | DELAYED_RELEASE_CAPSULE | Freq: Two times a day (BID) | ORAL | 0 refills | Status: DC

## 2017-11-09 NOTE — Progress Notes (Signed)
Let him know that he does have H pylori infection in stomach  He needs:  1) Omeprazole 20 mg 2 times a day x 14 d 2) Pepto Bismol 2 tabs (262 mg each) 4 times a day x 14 d 3) Metronidazole 250 mg 4 times a day x 14 d 4) doxycycline 100 mg 2 times a day x 14 d  After 14 d stop omeprazole also  In 4 weeks after treatment completed do H. Pylori stool antigen - dx H. Pylori gastritis   He also needs a screening colonoscopy pre-visit and pocedure appointment after he does the treatment above

## 2017-12-17 ENCOUNTER — Telehealth: Payer: Self-pay

## 2017-12-17 NOTE — Telephone Encounter (Signed)
Pt is a no show today. Called and spoken with the pt. Pt verbalize he completely forgot. Pt wanted to know if we call and reminding him of his appointment. Pt is heading out the door and will be back in a hour. Advised to call back by the in of the afternoon to keep his schedule procedure appointment.

## 2017-12-23 ENCOUNTER — Other Ambulatory Visit: Payer: Self-pay

## 2017-12-23 ENCOUNTER — Ambulatory Visit (AMBULATORY_SURGERY_CENTER): Payer: Self-pay

## 2017-12-23 VITALS — Ht 67.5 in | Wt 166.2 lb

## 2017-12-23 DIAGNOSIS — Z86010 Personal history of colonic polyps: Secondary | ICD-10-CM

## 2017-12-23 NOTE — Progress Notes (Signed)
Denies allergies to eggs or soy products. Denies complication of anesthesia or sedation. Denies use of weight loss medication. Denies use of O2.   Emmi instructions declined.  

## 2018-01-01 ENCOUNTER — Encounter: Payer: Self-pay | Admitting: Internal Medicine

## 2018-01-08 ENCOUNTER — Ambulatory Visit (AMBULATORY_SURGERY_CENTER): Payer: Medicare Other | Admitting: Internal Medicine

## 2018-01-08 ENCOUNTER — Encounter: Payer: Self-pay | Admitting: Internal Medicine

## 2018-01-08 ENCOUNTER — Other Ambulatory Visit: Payer: Self-pay

## 2018-01-08 VITALS — BP 94/68 | HR 59 | Temp 97.3°F | Resp 12 | Ht 68.0 in | Wt 172.0 lb

## 2018-01-08 DIAGNOSIS — D122 Benign neoplasm of ascending colon: Secondary | ICD-10-CM

## 2018-01-08 DIAGNOSIS — D125 Benign neoplasm of sigmoid colon: Secondary | ICD-10-CM | POA: Diagnosis not present

## 2018-01-08 DIAGNOSIS — Z86010 Personal history of colonic polyps: Secondary | ICD-10-CM

## 2018-01-08 MED ORDER — SODIUM CHLORIDE 0.9 % IV SOLN: 500.0000 mL | Freq: Once | INTRAVENOUS | Status: AC

## 2018-01-08 NOTE — Progress Notes (Signed)
Report to PACU, RN, vss, BBS= Clear.  

## 2018-01-08 NOTE — Patient Instructions (Addendum)
   I found and removed 3 polyps. I will let you know pathology results and when to have another routine colonoscopy by mail and/or My Chart.  I appreciate the opportunity to care for you. Gatha Mayer, MD, [facility]  Information on polyps given.  YOU HAD AN ENDOSCOPIC PROCEDURE TODAY AT [facility]:   Refer to the procedure report that was given to you for any specific questions about what was found during the examination.  If the procedure report does not answer your questions, please call your gastroenterologist to clarify.  If you requested that your care partner not be given the details of your procedure findings, then the procedure report has been included in a sealed envelope for you to review at your convenience later.  YOU SHOULD EXPECT: Some feelings of bloating in the abdomen. Passage of more gas than usual.  Walking can help get rid of the air that was put into your GI tract during the procedure and reduce the bloating. If you had a lower endoscopy (such as a colonoscopy or flexible sigmoidoscopy) you may notice spotting of blood in your stool or on the toilet paper. If you underwent a bowel prep for your procedure, you may not have a normal bowel movement for a few days.  Please Note:  You might notice some irritation and congestion in your nose or some drainage.  This is from the oxygen used during your procedure.  There is no need for concern and it should clear up in a day or so.  SYMPTOMS TO REPORT IMMEDIATELY:   Following lower endoscopy (colonoscopy or flexible sigmoidoscopy):  Excessive amounts of blood in the stool  Significant tenderness or worsening of abdominal pains  Swelling of the abdomen that is new, acute  Fever of 100F or higher   For urgent or emergent issues, a gastroenterologist can be reached at any hour by calling [phone].   DIET:  We do recommend a small meal at first, but then you may proceed to your regular diet.  Drink  plenty of fluids but you should avoid alcoholic beverages for 24 hours.  ACTIVITY:  You should plan to take it easy for the rest of today and you should NOT DRIVE or use heavy machinery until tomorrow (because of the sedation medicines used during the test).    FOLLOW UP: Our staff will call the number listed on your records the next business day following your procedure to check on you and address any questions or concerns that you may have regarding the information given to you following your procedure. If we do not reach you, we will leave a message.  However, if you are feeling well and you are not experiencing any problems, there is no need to return our call.  We will assume that you have returned to your regular daily activities without incident.  If any biopsies were taken you will be contacted by phone or by letter within the next 1-3 weeks.  Please call us at [phone] if you have not heard about the biopsies in 3 weeks.    SIGNATURES/CONFIDENTIALITY: You and/or your care partner have signed paperwork which will be entered into your electronic medical record.  These signatures attest to the fact that that the information above on your After Visit Summary has been reviewed and is understood.  Full responsibility of the confidentiality of this discharge information lies with you and/or your care-partner.

## 2018-01-08 NOTE — Progress Notes (Signed)
Called to room to assist during endoscopic procedure.  Patient ID and intended procedure confirmed with present staff. Received instructions for my participation in the procedure from the performing physician.  

## 2018-01-08 NOTE — Op Note (Signed)
[facility] Patient Name: Eagle Pitta Procedure Date: 01/08/2018 3:00 PM MRN: [id number] Endoscopist: Gatha Mayer , MD Age: 72 Referring MD:  Date of Birth: October 26, 1945 Gender: Male Account #: [id number] Procedure:                Colonoscopy Indications:              Surveillance: Personal history of adenomatous                            polyps on last colonoscopy > 3 years ago Medicines:                Propofol per Anesthesia, Monitored Anesthesia Care Procedure:                Pre-Anesthesia Assessment:                           - Prior to the procedure, a History and Physical                            was performed, and patient medications and                            allergies were reviewed. The patient's tolerance of                            previous anesthesia was also reviewed. The risks                            and benefits of the procedure and the sedation                            options and risks were discussed with the patient.                            All questions were answered, and informed consent                            was obtained. Prior Anticoagulants: The patient has                            taken no previous anticoagulant or antiplatelet                            agents. ASA Grade Assessment: II - A patient with                            mild systemic disease. After reviewing the risks                            and benefits, the patient was deemed in                            satisfactory condition to undergo the procedure.  After obtaining informed consent, the colonoscope                            was passed under direct vision. Throughout the                            procedure, the patient's blood pressure, pulse, and                            oxygen saturations were monitored continuously. The                            Colonoscope was introduced through the anus and                             advanced to the the cecum, identified by                            appendiceal orifice and ileocecal valve. The                            colonoscopy was performed without difficulty. The                            patient tolerated the procedure well. The quality                            of the bowel preparation was excellent. The                            ileocecal valve, appendiceal orifice, and rectum                            were photographed. The bowel preparation used was                            Miralax. Scope In: 3:08:52 PM Scope Out: 3:26:46 PM Scope Withdrawal Time: 0 hours 12 minutes 30 seconds  Total Procedure Duration: 0 hours 17 minutes 54 seconds  Findings:                 The perianal examination was normal.                           The digital rectal exam findings include enlarged                            prostate. Pertinent negatives include no palpable                            rectal lesions.                           Two sessile and semi-pedunculated polyps were found  in the sigmoid colon. The polyps were 7 to 8 mm in                            size. These polyps were removed with a hot snare.                            Resection and retrieval were complete. Verification                            of patient identification for the specimen was                            done. Estimated blood loss was minimal.                           A 5 mm polyp was found in the ascending colon. The                            polyp was sessile. The polyp was removed with a                            cold snare. Resection and retrieval were complete.                            Verification of patient identification for the                            specimen was done. Estimated blood loss was minimal.                           The exam was otherwise without abnormality on                            direct and retroflexion  views. Complications:            No immediate complications. Estimated Blood Loss:     Estimated blood loss was minimal. Impression:               - Enlarged prostate found on digital rectal exam.                           - Two 7 to 8 mm polyps in the sigmoid colon,                            removed with a hot snare. Resected and retrieved.                           - One 5 mm polyp in the ascending colon, removed                            with a cold snare. Resected and retrieved.                           -  The examination was otherwise normal on direct                            and retroflexion views.                           - Personal history of colonic polyps. adenomas                            since 2005 Recommendation:           - Patient has a contact number available for                            emergencies. The signs and symptoms of potential                            delayed complications were discussed with the                            patient. Return to normal activities tomorrow.                            Written discharge instructions were provided to the                            patient.                           - Resume previous diet.                           - Continue present medications.                           - Repeat colonoscopy is recommended for                            surveillance. The colonoscopy date will be                            determined after pathology results from today's                            exam become available for review.                           - No aspirin, ibuprofen, naproxen, or other                            non-steroidal anti-inflammatory drugs for 2 weeks                            after polyp removal. Gatha Mayer, MD 01/08/2018 3:37:14 PM This report has been signed electronically.

## 2018-01-08 NOTE — Progress Notes (Signed)
Pt's states no medical or surgical changes since previsit or office visit. 

## 2018-01-11 ENCOUNTER — Telehealth: Payer: Self-pay | Admitting: *Deleted

## 2018-01-11 ENCOUNTER — Telehealth: Payer: Self-pay

## 2018-01-11 NOTE — Telephone Encounter (Signed)
Left message on answering machine. 

## 2018-01-11 NOTE — Telephone Encounter (Signed)
  Follow up Call-  Call back number 01/08/2018  Post procedure Call Back phone  # [phone]  Permission to leave phone message Yes  Some recent data might be hidden     Patient questions:  Message left to call us if necessary.  Second call.

## 2018-01-17 ENCOUNTER — Encounter: Payer: Self-pay | Admitting: Internal Medicine

## 2018-01-17 DIAGNOSIS — Z86010 Personal history of colonic polyps: Secondary | ICD-10-CM

## 2018-01-17 NOTE — Progress Notes (Signed)
2 small adenomas, 1 mesenchymal polyp Recall 2024

## 2019-10-31 ENCOUNTER — Telehealth: Payer: Self-pay | Admitting: Dermatology

## 2019-10-31 NOTE — Telephone Encounter (Signed)
Patient left voice mail saying that the Halobetasol was not working at all and wanted to know what he should do.  Chart # [id number].

## 2019-11-02 NOTE — Telephone Encounter (Signed)
Patient called no better using halobetasol per Dr. Denna Haggard patient needs office visit for biopsy.  Office visit scheduled.

## 2019-11-02 NOTE — Telephone Encounter (Signed)
Check paper chart for when he was last seen and discuss with Dr. Darene Lamer

## 2019-11-15 ENCOUNTER — Ambulatory Visit: Payer: Medicare Other | Admitting: Dermatology

## 2019-11-15 ENCOUNTER — Encounter: Payer: Self-pay | Admitting: Dermatology

## 2019-11-15 ENCOUNTER — Other Ambulatory Visit: Payer: Self-pay

## 2019-11-15 DIAGNOSIS — D485 Neoplasm of uncertain behavior of skin: Secondary | ICD-10-CM | POA: Diagnosis not present

## 2019-11-15 DIAGNOSIS — L309 Dermatitis, unspecified: Secondary | ICD-10-CM | POA: Diagnosis not present

## 2019-11-15 DIAGNOSIS — D492 Neoplasm of unspecified behavior of bone, soft tissue, and skin: Secondary | ICD-10-CM

## 2019-11-15 NOTE — Patient Instructions (Signed)

## 2019-11-15 NOTE — Progress Notes (Signed)
   Follow-Up Visit   Subjective  Bradley Thompson is a 74 y.o. male who presents for the following: Follow-up (He states that even with the Halobetasol cream he is still itching and breaking out.  Also, feels like his arms/hands are swollen.  Possibly some swelling in his feet/ankles.  After he ran out of the halobetasol he started using hydrocortisone.).  rash Location: Worst on arms and legs but also on torso Duration: Several months Quality: Spreading Associated Signs/Symptoms: New swelling in right leg more than the right arm, itching Modifying Factors:  Severity:  Timing: Context: Missed his regular annual physical examination with lab because of Covid  The following portions of the chart were reviewed this encounter and updated as appropriate:     Objective  Well appearing patient in no apparent distress; mood and affect are within normal limits.  A focused examination was performed including head, neck, arms, legs, back, lymph nodes. Relevant physical exam findings are noted in the Assessment and Plan.   Assessment & Plan  Dermatitis Left Lower Leg - Posterior  Will obtain screening labs. If all OK, therapeutic options will include Dapsone, UVB, Dupixent. Future evaluation may include patch testing, opinion Dr. Wynona Luna.  Other Related Procedures CBC with Differential/Platelets CMP Sedimentation Rate Glucose 6 phosphate dehydrogenase  Neoplasm of skin (2) Right Forearm - Superior  Skin / nail biopsy Type of biopsy: tangential   Informed consent: discussed and consent obtained   Procedure prep:  Patient was prepped and draped in usual sterile fashion (Non sterile) Prep type:  Chlorhexidine Anesthesia: the lesion was anesthetized in a standard fashion   Anesthetic:  1% lidocaine w/ epinephrine 1-100,000 local infiltration Instrument used: flexible razor blade   Hemostasis achieved with: ferric subsulfate   Outcome: patient tolerated procedure well   Post-procedure  details: sterile dressing applied and wound care instructions given   Dressing type: bandage and petrolatum    Specimen 1 - Surgical pathology Differential Diagnosis: CTCL vs dermatitis Check Margins: No  Right Forearm - Inferior  Skin / nail biopsy Type of biopsy: tangential   Informed consent: discussed and consent obtained   Procedure prep:  Patient was prepped and draped in usual sterile fashion (Non sterile) Prep type:  Chlorhexidine Anesthesia: the lesion was anesthetized in a standard fashion   Anesthetic:  1% lidocaine w/ epinephrine 1-100,000 local infiltration Instrument used: flexible razor blade   Hemostasis achieved with: ferric subsulfate   Outcome: patient tolerated procedure well   Post-procedure details: sterile dressing applied and wound care instructions given   Dressing type: bandage and petrolatum    Specimen 2 - Surgical pathology Differential Diagnosis: CTCL vs dermatitis Check Margins: No

## 2019-11-16 LAB — COMPREHENSIVE METABOLIC PANEL
AG Ratio: 1.5 (calc) (ref 1.0–2.5)
ALT: 18 U/L (ref 9–46)
AST: 14 U/L (ref 10–35)
Albumin: 3.8 g/dL (ref 3.6–5.1)
Alkaline phosphatase (APISO): 64 U/L (ref 35–144)
BUN: 8 mg/dL (ref 7–25)
CO2: 31 mmol/L (ref 20–32)
Calcium: 9 mg/dL (ref 8.6–10.3)
Chloride: 105 mmol/L (ref 98–110)
Creat: 0.86 mg/dL (ref 0.70–1.18)
Globulin: 2.5 g/dL (calc) (ref 1.9–3.7)
Glucose, Bld: 138 mg/dL — ABNORMAL HIGH (ref 65–99)
Potassium: 4 mmol/L (ref 3.5–5.3)
Sodium: 143 mmol/L (ref 135–146)
Total Bilirubin: 0.4 mg/dL (ref 0.2–1.2)
Total Protein: 6.3 g/dL (ref 6.1–8.1)

## 2019-11-16 LAB — CBC WITH DIFFERENTIAL/PLATELET
Absolute Monocytes: 741 cells/uL (ref 200–950)
Basophils Absolute: 47 cells/uL (ref 0–200)
Basophils Relative: 0.6 %
Eosinophils Absolute: 523 cells/uL — ABNORMAL HIGH (ref 15–500)
Eosinophils Relative: 6.7 %
HCT: 49.7 % (ref 38.5–50.0)
Hemoglobin: 16.9 g/dL (ref 13.2–17.1)
Lymphs Abs: 1248 cells/uL (ref 850–3900)
MCH: 29.5 pg (ref 27.0–33.0)
MCHC: 34 g/dL (ref 32.0–36.0)
MCV: 86.7 fL (ref 80.0–100.0)
MPV: 9.9 fL (ref 7.5–12.5)
Monocytes Relative: 9.5 %
Neutro Abs: 5242 cells/uL (ref 1500–7800)
Neutrophils Relative %: 67.2 %
Platelets: 233 10*3/uL (ref 140–400)
RBC: 5.73 10*6/uL (ref 4.20–5.80)
RDW: 14.5 % (ref 11.0–15.0)
Total Lymphocyte: 16 %
WBC: 7.8 10*3/uL (ref 3.8–10.8)

## 2019-11-16 LAB — SEDIMENTATION RATE: Sed Rate: 2 mm/h (ref 0–20)

## 2019-11-16 LAB — GLUCOSE 6 PHOSPHATE DEHYDROGENASE: G-6PDH: 10.7 U/g Hgb (ref 7.0–20.5)

## 2019-11-18 ENCOUNTER — Telehealth: Payer: Self-pay | Admitting: Dermatology

## 2019-11-18 NOTE — Telephone Encounter (Signed)
Patient calling for blood test results. Patient says to leave detailed message if does not answer. [id number]

## 2019-11-21 NOTE — Telephone Encounter (Signed)
Tag, you're it. Results

## 2019-11-21 NOTE — Telephone Encounter (Signed)
Phone call to patient with is lab results.  Voicemail left for patient to give the office a call back.

## 2019-11-21 NOTE — Telephone Encounter (Signed)
-----   Message from Lavonna Monarch, MD sent at 11/17/2019  6:47 AM EDT ----- Results support diagnosis of adult eczema but are NOT specific. Blood test with modest incr blood sugar, slight incr allergy cells in blood (seen with eczema). 4 next step choices: patch test, UV light, opinion [id number] Dr Ronalee Red, trial of non-cortisone pill called Dapsone - Dr. Darene Lamer suggests last of these. Rx 25mg  #30, NR, one p.o. qd x10d then recheck cbc.

## 2019-11-21 NOTE — Telephone Encounter (Signed)
Phone call to patient with is Lab Results and Dr. Onalee Hua recommendations.  Patient aware of results and recommendations, patient will schedule for patch testing.

## 2019-12-05 ENCOUNTER — Other Ambulatory Visit: Payer: Self-pay

## 2019-12-05 ENCOUNTER — Ambulatory Visit: Payer: Medicare Other

## 2019-12-07 ENCOUNTER — Ambulatory Visit: Payer: Medicare Other

## 2019-12-09 ENCOUNTER — Ambulatory Visit: Payer: Medicare Other | Admitting: Dermatology

## 2021-04-17 ENCOUNTER — Encounter: Payer: Self-pay | Admitting: Family Medicine

## 2021-04-17 ENCOUNTER — Ambulatory Visit (INDEPENDENT_AMBULATORY_CARE_PROVIDER_SITE_OTHER): Payer: Medicare Other | Admitting: Family Medicine

## 2021-04-17 ENCOUNTER — Other Ambulatory Visit: Payer: Self-pay

## 2021-04-17 VITALS — BP 158/88 | HR 61 | Temp 98.4°F | Resp 16 | Ht 65.5 in | Wt 174.8 lb

## 2021-04-17 DIAGNOSIS — Z1159 Encounter for screening for other viral diseases: Secondary | ICD-10-CM | POA: Diagnosis not present

## 2021-04-17 DIAGNOSIS — Z1211 Encounter for screening for malignant neoplasm of colon: Secondary | ICD-10-CM

## 2021-04-17 DIAGNOSIS — Z125 Encounter for screening for malignant neoplasm of prostate: Secondary | ICD-10-CM | POA: Diagnosis not present

## 2021-04-17 DIAGNOSIS — Z0000 Encounter for general adult medical examination without abnormal findings: Secondary | ICD-10-CM | POA: Diagnosis not present

## 2021-04-17 DIAGNOSIS — Z7689 Persons encountering health services in other specified circumstances: Secondary | ICD-10-CM

## 2021-04-17 DIAGNOSIS — I10 Essential (primary) hypertension: Secondary | ICD-10-CM

## 2021-04-17 MED ORDER — LOSARTAN POTASSIUM 25 MG PO TABS: 25.0000 mg | ORAL_TABLET | Freq: Every day | ORAL | 1 refills | Status: DC

## 2021-04-17 NOTE — Progress Notes (Signed)
Patient is new to provider. Patient would like a CPE

## 2021-04-18 LAB — CBC WITH DIFFERENTIAL/PLATELET
Basophils Absolute: 0.1 10*3/uL (ref 0.0–0.2)
Basos: 1 %
EOS (ABSOLUTE): 0.2 10*3/uL (ref 0.0–0.4)
Eos: 3 %
Hematocrit: 48.2 % (ref 37.5–51.0)
Hemoglobin: 16.1 g/dL (ref 13.0–17.7)
Immature Grans (Abs): 0 10*3/uL (ref 0.0–0.1)
Immature Granulocytes: 0 %
Lymphocytes Absolute: 1.2 10*3/uL (ref 0.7–3.1)
Lymphs: 21 %
MCH: 28.5 pg (ref 26.6–33.0)
MCHC: 33.4 g/dL (ref 31.5–35.7)
MCV: 86 fL (ref 79–97)
Monocytes Absolute: 0.5 10*3/uL (ref 0.1–0.9)
Monocytes: 8 %
Neutrophils Absolute: 3.9 10*3/uL (ref 1.4–7.0)
Neutrophils: 67 %
Platelets: 201 10*3/uL (ref 150–450)
RBC: 5.64 x10E6/uL (ref 4.14–5.80)
RDW: 14.7 % (ref 11.6–15.4)
WBC: 5.8 10*3/uL (ref 3.4–10.8)

## 2021-04-18 LAB — CMP14+EGFR
ALT: 15 IU/L (ref 0–44)
AST: 16 IU/L (ref 0–40)
Albumin/Globulin Ratio: 2.3 — ABNORMAL HIGH (ref 1.2–2.2)
Albumin: 4.3 g/dL (ref 3.7–4.7)
Alkaline Phosphatase: 66 IU/L (ref 44–121)
BUN/Creatinine Ratio: 10 (ref 10–24)
BUN: 8 mg/dL (ref 8–27)
Bilirubin Total: 0.3 mg/dL (ref 0.0–1.2)
CO2: 21 mmol/L (ref 20–29)
Calcium: 8.9 mg/dL (ref 8.6–10.2)
Chloride: 105 mmol/L (ref 96–106)
Creatinine, Ser: 0.82 mg/dL (ref 0.76–1.27)
Globulin, Total: 1.9 g/dL (ref 1.5–4.5)
Glucose: 115 mg/dL — ABNORMAL HIGH (ref 65–99)
Potassium: 3.6 mmol/L (ref 3.5–5.2)
Sodium: 143 mmol/L (ref 134–144)
Total Protein: 6.2 g/dL (ref 6.0–8.5)
eGFR: 93 mL/min/{1.73_m2} (ref >59)

## 2021-04-18 LAB — PSA: Prostate Specific Ag, Serum: 1 ng/mL (ref 0.0–4.0)

## 2021-04-18 LAB — LIPID PANEL
Chol/HDL Ratio: 3.8 ratio (ref 0.0–5.0)
Cholesterol, Total: 208 mg/dL — ABNORMAL HIGH (ref 100–199)
HDL: 55 mg/dL (ref >39)
LDL Chol Calc (NIH): 141 mg/dL — ABNORMAL HIGH (ref 0–99)
Triglycerides: 68 mg/dL (ref 0–149)
VLDL Cholesterol Cal: 12 mg/dL (ref 5–40)

## 2021-04-18 LAB — HEPATITIS C ANTIBODY: Hep C Virus Ab: <0.1 s/co ratio (ref 0.0–0.9)

## 2021-04-18 NOTE — Progress Notes (Signed)
New Patient Office Visit  Subjective:  Patient ID: Bradley Thompson, male    DOB: 02-26-1946  Age: 75 y.o. MRN: [id number]  CC:  Chief Complaint  Patient presents with   Establish Care   Annual Exam    HPI Bradley Thompson presents for to establish care and for a routine annual exam. Patient denies acute complaints or concerns on today.   Past Medical History:  Diagnosis Date   Helicobacter pylori (H. pylori) infection 11/09/2017   History of colon polyps    HYPERLIPIDEMIA 12/21/2008    Past Surgical History:  Procedure Laterality Date   COLONOSCOPY     ROTATOR CUFF REPAIR     Dr. 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 . married '68 - 20 yrs/widowed; serial monogomy. 2 sons - '72, '82:  Oldest son taught in [place] for several years. He lives [place]; younger son lives in [place]; 1 grandson.. retired '07 - worked [employer]. Remains sexually active - uses barrier protection.   Social Determinants of Health   Financial Resource Strain: Not on file   Food Insecurity: Not on file  Transportation Needs: Not on file  Physical Activity: Not on file  Stress: Not on file  Social Connections: Not on file  Intimate Partner Violence: Not on file    ROS Review of Systems  All other systems reviewed and are negative.  Objective:   Today's Vitals: BP (!) 158/88 (BP Location: Right Arm, Patient Position: Sitting, Cuff Size: Large)   Pulse 61   Temp 98.4 F (36.9 C) (Oral)   Resp 16   Ht 5' 5.5" (1.664 m)   Wt 174 lb 12.8 oz (79.3 kg)   SpO2 96%   BMI 28.65 kg/m   Physical Exam Vitals and nursing note reviewed.  Constitutional:      General: He is not in acute distress. HENT:     Head: Normocephalic and atraumatic.     Right Ear: Tympanic membrane, ear canal and external ear normal.     Left Ear: Tympanic membrane, ear canal and external ear normal.     Nose: Nose normal.     Mouth/Throat:     Mouth: Mucous membranes are moist.     Pharynx: Oropharynx is clear.  Eyes:  Conjunctiva/sclera: Conjunctivae normal.     Pupils: Pupils are equal, round, and reactive to light.  Neck:     Thyroid: No thyromegaly.  Cardiovascular:     Rate and Rhythm: Normal rate and regular rhythm.     Heart sounds: Normal heart sounds. No murmur heard. Pulmonary:     Effort: Pulmonary effort is normal.     Breath sounds: Normal breath sounds.  Abdominal:     General: There is no distension.     Palpations: Abdomen is soft. There is no mass.     Tenderness: There is no abdominal tenderness.     Hernia: There is no hernia in the left inguinal area or right inguinal area.  Genitourinary:    Penis: Normal and circumcised.      Testes: Normal.  Musculoskeletal:        General: Normal range of motion.     Cervical back: Normal range of motion and neck supple.     Right lower leg: No edema.     Left lower leg: No edema.  Skin:    General: Skin is warm and dry.  Neurological:     General: No focal deficit present.     Mental Status: He is  alert and oriented to person, place, and time. Mental status is at baseline.  Psychiatric:        Mood and Affect: Mood normal.        Behavior: Behavior normal.    Assessment & Plan:  1. Visit for well man health check Unremarkable exam with exception of elevated blood pressure reading.  Routine labs ordered. - CMP14+EGFR - CBC with Differential  2. Essential hypertension Elevated reading.  Losartan 25 mg p.o. daily was prescribed.  Will monitor.  - losartan (COZAAR) 25 MG tablet; Take 1 tablet (25 mg total) by mouth daily.  Dispense: 90 tablet; Refill: 1 - Lipid Panel  3. Prostate cancer screening  - PSA  4. Need for hepatitis C screening test  - Hepatitis C Antibody  5. Encounter to establish care    Outpatient Encounter Medications as of 04/17/2021  Medication Sig   losartan (COZAAR) 25 MG tablet Take 1 tablet (25 mg total) by mouth daily.   Flaxseed, Linseed, (FLAX SEEDS PO) Take by mouth daily.   (Patient not taking: Reported on 04/17/2021)   halobetasol (ULTRAVATE) 0.05 % ointment APPLY TO AFFECTED AREA EVERY DAY (Patient not taking: Reported on 04/17/2021)   Facility-Administered Encounter Medications as of 04/17/2021  Medication   0.9 %  sodium chloride infusion    Follow-up: Return in about 4 weeks (around 05/15/2021) for follow up.   Becky Sax, MD

## 2021-11-25 ENCOUNTER — Ambulatory Visit: Payer: Self-pay | Admitting: *Deleted

## 2021-11-25 ENCOUNTER — Telehealth: Payer: Medicare Other | Admitting: Physician Assistant

## 2021-11-25 NOTE — Telephone Encounter (Signed)
Pt stated he possibly had a nail infection on a finger on his right hand . Pt stated finger is red and peeling has been going on for about 3 weeks. Pt stated can smell it sometimes, which is what makes him think it's infected.   ? ?No appointments available.  ? ?Pt seeking clinical advice.  ?Reason for Disposition ? Large swelling or bruise ?   Mild swelling, odor and drainage ? ?Answer Assessment - Initial Assessment Questions ?1. MECHANISM: "How did the injury happen?"  ?    "Picked a hang nail" ?2. ONSET: "When did the injury happen?" (Minutes or hours ago)  ?    3 weeks ago ?3. LOCATION: "What part of the finger is injured?" "Is the nail damaged?"  ?    Ring finger on right hand ?4. APPEARANCE of the INJURY: "What does the injury look like?"  ?    Red, mild swelling, "Smells" ?5. SEVERITY: "Can you use the hand normally?"  "Can you bend your fingers into a ball and then fully open them?" ?    Yes ?6. SIZE: For cuts, bruises, or swelling, ask: "How large is it?" (e.g., inches or centimeters;  entire finger)  ?     ?7. PAIN: "Is there pain?" If Yes, ask: "How bad is the pain?"    (e.g., Scale 1-10; or mild, moderate, severe) ? - NONE (0): no pain. ? - MILD (1-3): doesn't interfere with normal activities.  ? - MODERATE (4-7): interferes with normal activities or awakens from sleep. ? - SEVERE (8-10): excruciating pain, unable to hold a glass of water or bend finger even a little. ?    None ?8. TETANUS: For any breaks in the skin, ask: "When was the last tetanus booster?" ?     ?9. OTHER SYMPTOMS: "Do you have any other symptoms?" ?    No  "Except for smell" ? ?Protocols used: Finger Injury-A-AH ? ?

## 2021-11-25 NOTE — Telephone Encounter (Signed)
See previous NT encounter for finger infection. Will attempt to schedule another UC VV with [facility]. Patient has reported he is unable to get on My Chart account at this time.  ?

## 2021-11-25 NOTE — Telephone Encounter (Signed)
?  Chief Complaint: Finger Infection ?Symptoms: Ring finger of right hand swollen, red, odor. Pt unsure of drainage. "I think so, that's what odor coming from." ?Frequency: 3 weeks ago had hang nail "I messed with." ?Pertinent Negatives: Patient denies fever ?Disposition: '[]'$ ED /'[]'$ Urgent Care (no appt availability in office) / '[]'$ Appointment(In office/virtual)/ '[x]'$  [facility]/ '[]'$ Home Care/ '[]'$ Refused Recommended Disposition /'[]'$ [facility]/ '[]'$  Follow-up with PCP ?Additional Notes: States has bee soaking in Apple cider vinegar and applying honey. No available appts, secured [facility] for pt today. Reviewed process, care advise given, verbalizes understanding.  ? ?

## 2021-11-25 NOTE — Telephone Encounter (Signed)
Called patient back to assist with scheduling appt Virtual UC visit. Patient reports he has appt scheduled in office for tomorrow with PCP.  ?

## 2021-11-25 NOTE — Telephone Encounter (Signed)
Patient missed UC virtual appt today and reports he did not receive a call prior to the appt. Was not sure how to use. Attempted to reschedule appt and unable to schedule due to system failure. Patient requesting a call back .  ?

## 2021-11-25 NOTE — Progress Notes (Signed)
The patient no-showed for appointment despite this provider sending direct link x 3 with no response and waiting for at least 10 minutes from appointment time for patient to join. They will be marked as a NS for this appointment/time.   Charese Abundis M Chi Garlow, PA-C    

## 2021-11-26 ENCOUNTER — Ambulatory Visit (INDEPENDENT_AMBULATORY_CARE_PROVIDER_SITE_OTHER): Payer: Medicare Other | Admitting: Family Medicine

## 2021-11-26 ENCOUNTER — Encounter: Payer: Self-pay | Admitting: Family Medicine

## 2021-11-26 VITALS — BP 118/77 | HR 89 | Temp 98.0°F | Resp 16 | Wt 173.0 lb

## 2021-11-26 DIAGNOSIS — N529 Male erectile dysfunction, unspecified: Secondary | ICD-10-CM | POA: Diagnosis not present

## 2021-11-26 DIAGNOSIS — L03011 Cellulitis of right finger: Secondary | ICD-10-CM | POA: Diagnosis not present

## 2021-11-26 MED ORDER — SILDENAFIL CITRATE 100 MG PO TABS: 50.0000 mg | ORAL_TABLET | Freq: Every day | ORAL | 0 refills | Status: DC | PRN

## 2021-11-26 MED ORDER — DOXYCYCLINE HYCLATE 100 MG PO TABS: 100.0000 mg | ORAL_TABLET | Freq: Two times a day (BID) | ORAL | 0 refills | Status: DC

## 2021-11-26 NOTE — Progress Notes (Signed)
Patient is here with possible infection of the right hand ring finger. Patient said that all he has been putting on his finger is apple cider vinegar and raw honey. ?Patient hs had a tetanus sot in the last 10 years. ? ?

## 2021-11-26 NOTE — Progress Notes (Addendum)
? ?Established Patient Office Visit ? ?Subjective   ? ?Patient ID: Bradley Thompson, male    DOB: December 29, 1945  Age: 76 y.o. MRN: [id number] ? ?CC:  ?Chief Complaint  ?Patient presents with  ? Hand Pain  ? ? ?HPI ?Bradley Thompson presents with complaint of infection of finger. Patient reports that he had a "hangnail" that he tried to remove about a month ago. His finger is red and swollen and his nail has thickened. He denies fever/chills. The affected finger is the ring finger of his right hand and patient is right hand dominant. Patient had been using home remedy for sx.  ? ? ?Outpatient Encounter Medications as of 11/26/2021  ?Medication Sig  ? doxycycline (VIBRA-TABS) 100 MG tablet Take 1 tablet (100 mg total) by mouth 2 (two) times daily.  ? Flaxseed, Linseed, (FLAX SEEDS PO) Take by mouth daily.  ? halobetasol (ULTRAVATE) 0.05 % ointment   ? losartan (COZAAR) 25 MG tablet Take 1 tablet (25 mg total) by mouth daily.  ? BINAXNOW COVID-19 AG HOME TEST KIT See admin instructions.  ? FLUZONE HIGH-DOSE QUADRIVALENT 0.7 ML SUSY   ? PFIZER COVID-19 VAC BIVALENT injection   ? PREVNAR 20 0.5 ML injection   ? ?Facility-Administered Encounter Medications as of 11/26/2021  ?Medication  ? 0.9 %  sodium chloride infusion  ? ? ?Past Medical History:  ?Diagnosis Date  ? Helicobacter pylori (H. pylori) infection 11/09/2017  ? History of colon polyps   ? HYPERLIPIDEMIA 12/21/2008  ? ? ?Past Surgical History:  ?Procedure Laterality Date  ? COLONOSCOPY    ? ROTATOR CUFF REPAIR    ? Dr. Andee Poles shoulder  ? ? ?Family History  ?Problem Relation Age of Onset  ? Colon cancer Neg Hx   ? Esophageal cancer Neg Hx   ? Liver cancer Neg Hx   ? Pancreatic cancer Neg Hx   ? Rectal cancer Neg Hx   ? Stomach cancer Neg Hx   ? ? ?Social History  ? ?Socioeconomic History  ? Marital status: Widowed  ?  Spouse name: Not on file  ? Number of children: 2  ? Years of education: Not on file  ? Highest education level: Not on file  ?Occupational History  ?  Occupation: retired 07, [employer]  ?Tobacco Use  ? Smoking status: Never  ?  Passive exposure: Yes  ? Smokeless tobacco: Never  ?Vaping Use  ? Vaping Use: Never used  ?Substance and Sexual Activity  ? Alcohol use: Yes  ?  Alcohol/week: 3.0 standard drinks  ?  Types: 3 Standard drinks or equivalent per week  ? Drug use: Yes  ?  Types: Marijuana, Other-see comments  ?  Comment: had 1 joint yesterday  ? Sexual activity: Yes  ?  Partners: Female  ?Other Topics Concern  ? Not on file  ?Social History Narrative  ? HSG, [employer] - 3 yrs. . married '68 - 20 yrs/widowed; serial monogomy. 2 sons - '72, '82:  Oldest son taught in [place] for several years. He lives [place]; younger son lives in [place]; 1 grandson.. retired '07 - worked [employer]. Remains sexually active - uses barrier protection.  ? ?Social Determinants of Health  ? ?Financial Resource Strain: Not on file  ?Food Insecurity: Not on file  ?Transportation Needs: Not on file  ?Physical Activity: Not on file  ?Stress: Not on file  ?Social Connections: Not on file  ?Intimate Partner Violence: Not on file  ? ? ?Review of Systems  ?  Constitutional:  Negative for chills and fever.  ?All other systems reviewed and are negative. ? ?  ? ? ?Objective   ? ?BP 118/77   Pulse 89   Temp 98 ?F (36.7 ?C) (Oral)   Resp 16   Wt 173 lb (78.5 kg)   SpO2 96%   BMI 28.35 kg/m?  ? ?Physical Exam ?Vitals and nursing note reviewed.  ?Cardiovascular:  ?   Rate and Rhythm: Normal rate and regular rhythm.  ?Pulmonary:  ?   Effort: Pulmonary effort is normal.  ?   Breath sounds: Normal breath sounds.  ?Musculoskeletal:  ?   Right hand: Swelling and tenderness present. Decreased range of motion.  ?   Comments: Patient with swelling erythema, induration, and decreased ROM of right ring finger with thickened nail/bed  ?Neurological:  ?   Mental Status: He is alert.  ? ? ? ?  ? ?Assessment & Plan:  ? ?1. Cellulitis of right ring finger ?Doxycycline prescribed. ? Osteomyelitis. Referral  to Ortho for further eval/mgt ?- Ambulatory referral to Orthopedic Surgery ? ?2. Erectile dysfunction, unspecified erectile dysfunction type ?Viagra prescribed ? ? ? ?No follow-ups on file.  ? ?Becky Sax, MD ? ? ?

## 2021-12-02 ENCOUNTER — Ambulatory Visit: Payer: Medicare Other | Admitting: Orthopedic Surgery

## 2021-12-02 ENCOUNTER — Ambulatory Visit (INDEPENDENT_AMBULATORY_CARE_PROVIDER_SITE_OTHER): Payer: Medicare Other

## 2021-12-02 DIAGNOSIS — L089 Local infection of the skin and subcutaneous tissue, unspecified: Secondary | ICD-10-CM

## 2021-12-02 NOTE — Progress Notes (Signed)
? ?Office Visit Note ?  ?Patient: Bradley Thompson           ?Date of Birth: 1945/08/13           ?MRN: [id number] ?Visit Date: 12/02/2021 ?             ?Requested by: Dorna Mai, MD ?[phone] ?[place],  [place] [address] ?PCP: Dorna Mai, MD ? ? ?Assessment & Plan: ?Visit Diagnoses:  ?1. Finger infection   ? ? ?Plan: Patient seems to have chronic paronychia of ring ring finger.  He has never noticed any purulent drainage.  He has no pain.  Given the significant swelling and soft tissue changes, I would like to get an MRI of his finger to rule out osteomyelitis.  I do not see any obvious erosions on x-ray.  We will also start treating this as presumed chronic paronychia with topical gentian violet.  I can see him back after the MRI is completed.  ? ?Follow-Up Instructions: No follow-ups on file.  ? ?Orders:  ?Orders Placed This Encounter  ?Procedures  ? XR Finger Ring Right  ? MR HAND RIGHT W WO CONTRAST  ? ?No orders of the defined types were placed in this encounter. ? ? ? ? Procedures: ?No procedures performed ? ? ?Clinical Data: ?No additional findings. ? ? ?Subjective: ?Chief Complaint  ?Patient presents with  ? Other  ?  Right ring finger infection  ? ? ?This is a 76 year old right-hand-dominant male who is retired but previously worked in receiving at [employer] presents with swelling and erythema of the right ring finger around the proximal nail fold to the level of the IP joint.  Is been present about a month.  He had an ingrown nail initially.  He was working in the yard where the nail got caught with dirt and yard material caught under the nail.  He has since been soaking the finger in apple cider vinegar and honey.  Has been putting Vaseline and coconut oil on the paronychia skin.  He was started on an oral antibiotic last week.  He denies any pain in the finger.  He does have significant swelling of the finger around the DIP joint to the tip.  There is a dystrophic nail  with nonadherence at the mid aspect of the nail plate.  He has never noticed any purulence. ? ? ?Review of Systems ? ? ?Objective: ?Vital Signs: There were no vitals taken for this visit. ? ?Physical Exam ?Constitutional:   ?   Appearance: Normal appearance.  ?Cardiovascular:  ?   Rate and Rhythm: Normal rate.  ?   Pulses: Normal pulses.  ?Pulmonary:  ?   Effort: Pulmonary effort is normal.  ?Skin: ?   General: Skin is warm and dry.  ?   Capillary Refill: Capillary refill takes less than 2 seconds.  ?Neurological:  ?   Mental Status: He is alert.  ? ? ?Right Hand Exam  ? ?Tenderness  ?The patient is experiencing no tenderness.  ? ?Other  ?Erythema: absent ?Sensation: normal ?Pulse: present ? ?Comments:  Moderate to severe swelling and erythema of proximal and lateral nail folds with no associated purulence.  Dystrophic nail plate.  Diffuse swelling and sloughing skin from DIP joint to finger tip.   ? ? ? ? ?Specialty Comments:  ?No specialty comments available. ? ?Imaging: ?No results found. ? ? ?PMFS History: ?Patient Active Problem List  ? Diagnosis Date Noted  ? Helicobacter pylori (H.  pylori) infection 11/09/2017  ? Lipoma 03/16/2014  ? Preventative health care 02/26/2011  ? Hyperlipidemia 12/21/2008  ? Hx of adenomatous colonic polyps 11/29/2003  ? ?Past Medical History:  ?Diagnosis Date  ? Helicobacter pylori (H. pylori) infection 11/09/2017  ? History of colon polyps   ? HYPERLIPIDEMIA 12/21/2008  ?  ?Family History  ?Problem Relation Age of Onset  ? Colon cancer Neg Hx   ? Esophageal cancer Neg Hx   ? Liver cancer Neg Hx   ? Pancreatic cancer Neg Hx   ? Rectal cancer Neg Hx   ? Stomach cancer Neg Hx   ?  ?Past Surgical History:  ?Procedure Laterality Date  ? COLONOSCOPY    ? ROTATOR CUFF REPAIR    ? Dr. Andee Poles shoulder  ? ?Social History  ? ?Occupational History  ? Occupation: retired 07, [employer]  ?Tobacco Use  ? Smoking status: Never  ?  Passive exposure: Yes  ? Smokeless tobacco: Never  ?Vaping  Use  ? Vaping Use: Never used  ?Substance and Sexual Activity  ? Alcohol use: Yes  ?  Alcohol/week: 3.0 standard drinks  ?  Types: 3 Standard drinks or equivalent per week  ? Drug use: Yes  ?  Types: Marijuana, Other-see comments  ?  Comment: had 1 joint yesterday  ? Sexual activity: Yes  ?  Partners: Female  ? ? ? ? ? ? ?

## 2021-12-15 ENCOUNTER — Ambulatory Visit
Admission: RE | Admit: 2021-12-15 | Discharge: 2021-12-15 | Disposition: A | Discharge status: Home / Self Care | Payer: Medicare Other | Source: Ambulatory Visit | Attending: Orthopedic Surgery | Admitting: Orthopedic Surgery

## 2021-12-15 DIAGNOSIS — L089 Local infection of the skin and subcutaneous tissue, unspecified: Secondary | ICD-10-CM

## 2021-12-15 MED ORDER — GADOBENATE DIMEGLUMINE 529 MG/ML IV SOLN
16.0000 mL | Freq: Once | INTRAVENOUS | Status: AC | PRN
  Administered 2021-12-15: 16 mL via INTRAVENOUS

## 2021-12-26 ENCOUNTER — Other Ambulatory Visit: Payer: Self-pay | Admitting: Family Medicine

## 2021-12-27 NOTE — Telephone Encounter (Signed)
Requested Prescriptions  Pending Prescriptions Disp Refills  . sildenafil (VIAGRA) 100 MG tablet [Pharmacy Med Name: SILDENAFIL 100 MG TABLET] 6 tablet 1    Sig: TAKE 0.5-1 TABLETS BY MOUTH DAILY AS NEEDED FOR ERECTILE DYSFUNCTION.     Urology: Erectile Dysfunction Agents Passed - 12/26/2021  9:45 AM      Passed - AST in normal range and within 360 days    AST  Date Value Ref Range Status  04/17/2021 16 0 - 40 IU/L Final         Passed - ALT in normal range and within 360 days    ALT  Date Value Ref Range Status  04/17/2021 15 0 - 44 IU/L Final         Passed - Last BP in normal range    BP Readings from Last 1 Encounters:  11/26/21 118/77         Passed - Valid encounter within last 12 months    Recent Outpatient Visits          1 month ago Cellulitis of right ring finger   Primary Care at [facility], MD   8 months ago Visit for well man health check   Primary Care at [facility], MD

## 2021-12-29 ENCOUNTER — Other Ambulatory Visit: Payer: Self-pay | Admitting: Family Medicine

## 2021-12-29 DIAGNOSIS — I10 Essential (primary) hypertension: Secondary | ICD-10-CM

## 2021-12-29 NOTE — Telephone Encounter (Signed)
Refilled per patient request. 

## 2022-01-13 ENCOUNTER — Ambulatory Visit: Payer: Self-pay

## 2022-01-13 NOTE — Telephone Encounter (Signed)
    Chief Complaint: Abdominal pain, above belly button Symptoms: Pain, some constipation Frequency: 1 month ago Pertinent Negatives: Patient denies fever Disposition: '[]'$ ED /'[]'$ Urgent Care (no appt availability in office) / '[]'$ Appointment(In office/virtual)/ '[]'$  [facility]/ '[]'$ Home Care/ '[]'$ Refused Recommended Disposition /'[]'$ [facility]/ '[x]'$  Follow-up with PCP Additional Notes: Pt. Asking to be worked in today. Please advise pt.   Answer Assessment - Initial Assessment Questions 1. LOCATION: "Where does it hurt?"      Above belly button 2. RaDIATION: "Does the pain shoot anywhere else?" (e.g., chest, back)     No 3. ONSET: "When did the pain begin?" (Minutes, hours or days ago)      1 month ago 4. SUDDEN: "Gradual or sudden onset?"     Gradual 5. PATTERN "Does the pain come and go, or is it constant?"    - If constant: "Is it getting better, staying the same, or worsening?"      (Note: Constant means the pain never goes away completely; most serious pain is constant and it progresses)     - If intermittent: "How long does it last?" "Do you have pain now?"     (Note: Intermittent means the pain goes away completely between bouts)     Constant 6. SEVERITY: "How bad is the pain?"  (e.g., Scale 1-10; mild, moderate, or severe)    - MILD (1-3): doesn't interfere with normal activities, abdomen soft and not tender to touch     - MODERATE (4-7): interferes with normal activities or awakens from sleep, abdomen tender to touch     - SEVERE (8-10): excruciating pain, doubled over, unable to do any normal activities       Now- 7 7. RECURRENT SYMPTOM: "Have you ever had this type of stomach pain before?" If Yes, ask: "When was the last time?" and "What happened that time?"      Yes 8. CAUSE: "What do you think is causing the stomach pain?"     Unsure 9. RELIEVING/AGGRAVATING FACTORS: "What makes it better or worse?" (e.g., movement, antacids, bowel movement)     No 10.  OTHER SYMPTOMS: "Do you have any other symptoms?" (e.g., back pain, diarrhea, fever, urination pain, vomiting)       Some constipation  Protocols used: Abdominal Pain - Male-A-AH

## 2022-01-14 ENCOUNTER — Encounter: Payer: Self-pay | Admitting: Physician Assistant

## 2022-01-14 ENCOUNTER — Ambulatory Visit: Payer: Medicare Other | Admitting: Physician Assistant

## 2022-01-14 VITALS — BP 132/88 | HR 87 | Resp 18 | Ht 67.0 in | Wt 161.0 lb

## 2022-01-14 DIAGNOSIS — K219 Gastro-esophageal reflux disease without esophagitis: Secondary | ICD-10-CM | POA: Diagnosis not present

## 2022-01-14 DIAGNOSIS — R1013 Epigastric pain: Secondary | ICD-10-CM

## 2022-01-14 MED ORDER — PANTOPRAZOLE SODIUM 40 MG PO TBEC: 40.0000 mg | DELAYED_RELEASE_TABLET | Freq: Every day | ORAL | 3 refills | Status: DC

## 2022-01-14 NOTE — Progress Notes (Signed)
Established Patient Office Visit  Subjective   Patient ID: Bradley Thompson, male    DOB: 09/13/1945  Age: 76 y.o. MRN: [id number]  Chief Complaint  Patient presents with   Abdominal Pain    Thinks it is potential ulcer    States that he has been having epigastric pain and tenderness for the past month, describes it as a gnawing, growling, endorses occasional heartburn and acid reflux.  States that it is neither better or worse before or after eating.  States that he has been eating soup, honey, using Pepto-Bismol, cranberry juice and Keefer without relief.  States that he did take a 10-day course of doxycycline at the beginning of May.  Denies NSAID use.  Denies nausea, vomiting, diarrhea.      Past Medical History:  Diagnosis Date   Helicobacter pylori (H. 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 . married '68 - 20 yrs/widowed; serial monogomy. 2 sons - '72, '82:  Oldest son taught in [place] for several years. He lives [place]; younger son lives in [place]; 1 grandson.. retired '07 - worked [employer]. Remains sexually active - uses barrier protection.   Social Determinants of Health    Financial Resource Strain: Not on file  Food Insecurity: Not on file  Transportation Needs: Not on file  Physical Activity: Not on file  Stress: Not on file  Social Connections: Not on file  Intimate Partner Violence: Not on file   Family History  Problem Relation Age of Onset   Colon cancer Neg Hx    Esophageal cancer Neg Hx    Liver cancer Neg Hx    Pancreatic cancer Neg Hx    Rectal cancer Neg Hx    Stomach cancer Neg Hx    No Known Allergies    Review of Systems  Constitutional:  Negative for chills and fever.  HENT: Negative.    Eyes: Negative.   Respiratory:  Negative for shortness of breath.   Cardiovascular:  Negative for chest pain.  Gastrointestinal:  Positive for abdominal pain and heartburn. Negative for diarrhea, nausea and vomiting.  Genitourinary:  Negative for dysuria.  Musculoskeletal: Negative.   Skin: Negative.   Neurological: Negative.   Endo/Heme/Allergies: Negative.   Psychiatric/Behavioral: Negative.        Objective:     BP 132/88 (BP Location: Left Arm, Patient Position: Sitting, Cuff Size: Normal)   Pulse 87   Resp 18   Ht '5\' 7"'$  (1.702 m)   Wt 161 lb (73 kg)  SpO2 97%   BMI 25.22 kg/m    Physical Exam Vitals and nursing note reviewed.  Constitutional:      Appearance: Normal appearance.  HENT:     Head: Normocephalic and atraumatic.     Right Ear: External ear normal.     Left Ear: External ear normal.     Nose: Nose normal.     Mouth/Throat:     Mouth: Mucous membranes are moist.     Pharynx: Oropharynx is clear.  Eyes:     Extraocular Movements: Extraocular movements intact.     Conjunctiva/sclera: Conjunctivae normal.     Pupils: Pupils are equal, round, and reactive to light.  Cardiovascular:     Rate and Rhythm: Normal rate and regular rhythm.     Pulses: Normal pulses.     Heart sounds: Normal heart sounds.  Pulmonary:     Effort: Pulmonary effort is normal.     Breath sounds: Normal breath sounds.   Abdominal:     General: Abdomen is flat. Bowel sounds are normal.     Palpations: Abdomen is soft.     Tenderness: There is abdominal tenderness in the epigastric area. Negative signs include Murphy's sign.  Musculoskeletal:        General: Normal range of motion.     Cervical back: Normal range of motion and neck supple.  Skin:    General: Skin is warm and dry.  Neurological:     General: No focal deficit present.     Mental Status: He is alert and oriented to person, place, and time.  Psychiatric:        Mood and Affect: Mood normal.        Behavior: Behavior normal.        Thought Content: Thought content normal.        Judgment: Judgment normal.        Assessment & Plan:   Problem List Items Addressed This Visit   None Visit Diagnoses     Abdominal pain, epigastric    -  Primary   Relevant Medications   pantoprazole (PROTONIX) 40 MG tablet   Gastroesophageal reflux disease, unspecified whether esophagitis present       Relevant Medications   pantoprazole (PROTONIX) 40 MG tablet     1. Abdominal pain, epigastric History of H. pylori, no NSAID use, recent doxycycline use.  Trial Protonix once daily.  Patient education given on lifestyle modifications.  Red flags given for prompt reevaluation. - pantoprazole (PROTONIX) 40 MG tablet; Take 1 tablet (40 mg total) by mouth daily.  Dispense: 30 tablet; Refill: 3  2. Gastroesophageal reflux disease, unspecified whether esophagitis present  - pantoprazole (PROTONIX) 40 MG tablet; Take 1 tablet (40 mg total) by mouth daily.  Dispense: 30 tablet; Refill: 3    I have reviewed the patient's medical history (PMH, PSH, Social History, Family History, Medications, and allergies) , and have been updated if relevant. I spent 31 minutes reviewing chart and  face to face time with patient.    Return in about 1 month (around 02/13/2022) for with Dr. Redmond Pulling at [place].    Loraine Grip Mayers, PA-C

## 2022-01-14 NOTE — Patient Instructions (Addendum)
You are going to start taking Protonix once daily in the morning.  I encourage you to follow a gentle diet as well.   Please make sure to follow up orthopedics their information is:  [address] [place], [place] [address] Phone: [phone]  Kennieth Rad, PA-C [facility] [email or web address]   Food Choices for Gastroesophageal Reflux Disease, Adult When you have gastroesophageal reflux disease (GERD), the foods you eat and your eating habits are very important. Choosing the right foods can help ease the discomfort of GERD. Consider working with a dietitian to help you make healthy food choices. What are tips for following this plan? Reading food labels Look for foods that are low in saturated fat. Foods that have less than 5% of daily value (DV) of fat and 0 g of trans fats may help with your symptoms. Cooking Cook foods using methods other than frying. This may include baking, steaming, grilling, or broiling. These are all methods that do not need a lot of fat for cooking. To add flavor, try to use herbs that are low in spice and acidity. Meal planning  Choose healthy foods that are low in fat, such as fruits, vegetables, whole grains, low-fat dairy products, lean meats, fish, and poultry. Eat frequent, small meals instead of three large meals each day. Eat your meals slowly, in a relaxed setting. Avoid bending over or lying down until 2-3 hours after eating. Limit high-fat foods such as fatty meats or fried foods. Limit your intake of fatty foods, such as oils, butter, and shortening. Avoid the following as told by your health care provider: Foods that cause symptoms. These may be different for different people. Keep a food diary to keep track of foods that cause symptoms. Alcohol. Drinking large amounts of liquid with meals. Eating meals during the 2-3 hours before bed. Lifestyle Maintain a healthy  weight. Ask your health care provider what weight is healthy for you. If you need to lose weight, work with your health care provider to do so safely. Exercise for at least 30 minutes on 5 or more days each week, or as told by your health care provider. Avoid wearing clothes that fit tightly around your waist and chest. Do not use any products that contain nicotine or tobacco. These products include cigarettes, chewing tobacco, and vaping devices, such as e-cigarettes. If you need help quitting, ask your health care provider. Sleep with the head of your bed raised. Use a wedge under the mattress or blocks under the bed frame to raise the head of the bed. Chew sugar-free gum after mealtimes. What foods should I eat?  Eat a healthy, well-balanced diet of fruits, vegetables, whole grains, low-fat dairy products, lean meats, fish, and poultry. Each person is different. Foods that may trigger symptoms in one person may not trigger any symptoms in another person. Work with your health care provider to identify foods that are safe for you. The items listed above may not be a complete list of recommended foods and beverages. Contact a dietitian for more information. What foods should I avoid? Limiting some of these foods may help manage the symptoms of GERD. Everyone is different. Consult a dietitian or your health care provider to help you identify the exact foods to avoid, if any. Fruits Any fruits prepared with added fat. Any fruits that cause symptoms. For some people this may include citrus fruits, such as oranges, grapefruit, pineapple, and lemons. Vegetables Deep-fried vegetables. [place] fries. Any vegetables  prepared with added fat. Any vegetables that cause symptoms. For some people, this may include tomatoes and tomato products, chili peppers, onions and garlic, and horseradish. Grains Pastries or quick breads with added fat. Meats and other proteins High-fat meats, such as fatty beef or pork,  hot dogs, ribs, ham, sausage, salami, and bacon. Fried meat or protein, including fried fish and fried chicken. Nuts and nut butters, in large amounts. Dairy Whole milk and chocolate milk. Sour cream. Cream. Ice cream. Cream cheese. Milkshakes. Fats and oils Butter. Margarine. Shortening. Ghee. Beverages Coffee and tea, with or without caffeine. Carbonated beverages. Sodas. Energy drinks. Fruit juice made with acidic fruits, such as orange or grapefruit. Tomato juice. Alcoholic drinks. Sweets and desserts Chocolate and cocoa. Donuts. Seasonings and condiments Pepper. Peppermint and spearmint. Added salt. Any condiments, herbs, or seasonings that cause symptoms. For some people, this may include curry, hot sauce, or vinegar-based salad dressings. The items listed above may not be a complete list of foods and beverages to avoid. Contact a dietitian for more information. Questions to ask your health care provider Diet and lifestyle changes are usually the first steps that are taken to manage symptoms of GERD. If diet and lifestyle changes do not improve your symptoms, talk with your health care provider about taking medicines. Where to find more information International Foundation for Gastrointestinal Disorders: aboutgerd.org Summary When you have gastroesophageal reflux disease (GERD), food and lifestyle choices may be very helpful in easing the discomfort of GERD. Eat frequent, small meals instead of three large meals each day. Eat your meals slowly, in a relaxed setting. Avoid bending over or lying down until 2-3 hours after eating. Limit high-fat foods such as fatty meats or fried foods. This information is not intended to replace advice given to you by your health care provider. Make sure you discuss any questions you have with your health care provider. Document Revised: 01/23/2020 Document Reviewed: 01/23/2020 Elsevier Patient Education  [place].

## 2022-02-13 ENCOUNTER — Encounter: Payer: Self-pay | Admitting: Family Medicine

## 2022-02-13 ENCOUNTER — Ambulatory Visit (INDEPENDENT_AMBULATORY_CARE_PROVIDER_SITE_OTHER): Payer: Medicare Other | Admitting: Family Medicine

## 2022-02-13 VITALS — BP 123/81 | HR 106 | Temp 98.1°F | Resp 16 | Wt 167.8 lb

## 2022-02-13 DIAGNOSIS — N529 Male erectile dysfunction, unspecified: Secondary | ICD-10-CM

## 2022-02-13 DIAGNOSIS — I10 Essential (primary) hypertension: Secondary | ICD-10-CM

## 2022-02-13 MED ORDER — LOSARTAN POTASSIUM 25 MG PO TABS: 25.0000 mg | ORAL_TABLET | Freq: Every day | ORAL | 1 refills | Status: DC

## 2022-02-13 MED ORDER — SILDENAFIL CITRATE 100 MG PO TABS
ORAL_TABLET | ORAL | 1 refills | Status: DC
Start: 2022-02-13 — End: 2023-03-11

## 2022-02-13 NOTE — Progress Notes (Signed)
Established Patient Office Visit  Subjective    Patient ID: Bradley Thompson, male    DOB: 31-Jan-1946  Age: 76 y.o. MRN: [id number]  CC:  Chief Complaint  Patient presents with   Follow-up   Hypertension    HPI Bradley Thompson presents for follow up of hypertension. Patient denies acute complaints or concerns.    Outpatient Encounter Medications as of 02/13/2022  Medication Sig   [DISCONTINUED] losartan (COZAAR) 25 MG tablet TAKE 1 TABLET (25 MG TOTAL) BY MOUTH DAILY.   BINAXNOW COVID-19 AG HOME TEST KIT See admin instructions. (Patient not taking: Reported on 01/14/2022)   doxycycline (VIBRA-TABS) 100 MG tablet Take 1 tablet (100 mg total) by mouth 2 (two) times daily. (Patient not taking: Reported on 01/14/2022)   Flaxseed, Linseed, (FLAX SEEDS PO) Take by mouth daily. (Patient not taking: Reported on 01/14/2022)   FLUZONE HIGH-DOSE QUADRIVALENT 0.7 ML SUSY  (Patient not taking: Reported on 01/14/2022)   halobetasol (ULTRAVATE) 0.05 % ointment  (Patient not taking: Reported on 01/14/2022)   losartan (COZAAR) 25 MG tablet Take 1 tablet (25 mg total) by mouth daily.   pantoprazole (PROTONIX) 40 MG tablet Take 1 tablet (40 mg total) by mouth daily. (Patient not taking: Reported on 02/13/2022)   PFIZER COVID-19 VAC BIVALENT injection  (Patient not taking: Reported on 01/14/2022)   PREVNAR 20 0.5 ML injection  (Patient not taking: Reported on 01/14/2022)   sildenafil (VIAGRA) 100 MG tablet TAKE 0.5-1 TABLETS BY MOUTH DAILY AS NEEDED FOR ERECTILE DYSFUNCTION.   [DISCONTINUED] sildenafil (VIAGRA) 100 MG tablet TAKE 0.5-1 TABLETS BY MOUTH DAILY AS NEEDED FOR ERECTILE DYSFUNCTION. (Patient not taking: Reported on 01/14/2022)   Facility-Administered Encounter Medications as of 02/13/2022  Medication   0.9 %  sodium chloride infusion    Past Medical History:  Diagnosis Date   Helicobacter pylori (H. pylori) infection 11/09/2017   History of colon polyps    HYPERLIPIDEMIA 12/21/2008    Past  Surgical History:  Procedure Laterality Date   COLONOSCOPY     ROTATOR CUFF REPAIR     Dr. 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 . married '68 - 20 yrs/widowed; serial monogomy. 2 sons - '72, '82:  Oldest son taught in [place] for several years. He lives  [place]; younger son lives in [place]; 1 grandson.. retired '07 - worked [employer]. Remains sexually active - uses barrier protection.   Social Determinants of Health   Financial Resource Strain: Not on file  Food Insecurity: Not on file  Transportation Needs: Not on file  Physical Activity: Not on file  Stress: Not on file  Social Connections: Not on file  Intimate Partner Violence: Not on file    Review of Systems  All other systems reviewed and are negative.       Objective    BP 123/81   Pulse (!) 106   Temp 98.1 F (36.7 C) (Oral)   Resp 16   Wt 167 lb 12.8 oz (76.1 kg)   SpO2 97%   BMI 26.28 kg/m   Physical Exam Vitals and nursing note reviewed.  Constitutional:       General: He is not in acute distress. Cardiovascular:     Rate and Rhythm: Normal rate and regular rhythm.  Pulmonary:     Effort: Pulmonary effort is normal.     Breath sounds: Normal breath sounds.  Abdominal:     Palpations: Abdomen is soft.     Tenderness: There is no abdominal tenderness.  Musculoskeletal:     Right lower leg: No edema.     Left lower leg: No edema.  Neurological:     General: No focal deficit present.     Mental Status: He is alert and oriented to person, place, and time.         Assessment & Plan:   1. Essential hypertension Appears stable with present management. Continue and monitor - losartan (COZAAR) 25 MG tablet; Take 1 tablet (25 mg total) by mouth daily.  Dispense: 90 tablet; Refill: 1  2. Erectile dysfunction, unspecified erectile dysfunction type Meds refilled    Return in about 6 months (around 08/16/2022) for follow up.   Becky Sax, MD

## 2022-05-05 ENCOUNTER — Ambulatory Visit (INDEPENDENT_AMBULATORY_CARE_PROVIDER_SITE_OTHER): Payer: Self-pay | Admitting: *Deleted

## 2022-05-05 ENCOUNTER — Ambulatory Visit: Payer: Self-pay | Admitting: *Deleted

## 2022-05-05 ENCOUNTER — Ambulatory Visit
Admission: EM | Admit: 2022-05-05 | Discharge: 2022-05-05 | Disposition: A | Discharge status: Home / Self Care | Payer: Medicare Other | Attending: Physician Assistant | Admitting: Physician Assistant

## 2022-05-05 DIAGNOSIS — L309 Dermatitis, unspecified: Secondary | ICD-10-CM | POA: Diagnosis not present

## 2022-05-05 MED ORDER — PREDNISONE 20 MG PO TABS: 40.0000 mg | ORAL_TABLET | Freq: Every day | ORAL | 0 refills | Status: AC

## 2022-05-05 MED ORDER — METHYLPREDNISOLONE SODIUM SUCC 125 MG IJ SOLR
80.0000 mg | Freq: Once | INTRAMUSCULAR | Status: AC
  Administered 2022-05-05: 80 mg via INTRAMUSCULAR

## 2022-05-05 NOTE — Telephone Encounter (Signed)
Patient is checking in at [facility] as I type

## 2022-05-05 NOTE — ED Provider Notes (Signed)
[name]    CSN: [id number] Arrival date & time: 05/05/22  1110      History   Chief Complaint No chief complaint on file.   HPI Bradley Thompson is a 76 y.o. male.   HPI  Past Medical History:  Diagnosis Date   Helicobacter pylori (H. pylori) infection 11/09/2017   History of colon polyps    HYPERLIPIDEMIA 12/21/2008    Patient Active Problem List   Diagnosis Date Noted   Helicobacter pylori (H. pylori) infection 11/09/2017   Lipoma 03/16/2014   Preventative health care 02/26/2011   Hyperlipidemia 12/21/2008   Hx of adenomatous colonic polyps 11/29/2003    Past Surgical History:  Procedure Laterality Date   COLONOSCOPY     ROTATOR CUFF REPAIR     Dr. Andee Poles shoulder       Home Medications    Prior to Admission medications   Medication Sig Start Date End Date Taking? Authorizing Provider  BINAXNOW COVID-19 AG HOME TEST KIT See admin instructions. Patient not taking: Reported on 01/14/2022 06/10/21   [provider]  doxycycline (VIBRA-TABS) 100 MG tablet Take 1 tablet (100 mg total) by mouth 2 (two) times daily. Patient not taking: Reported on 01/14/2022 11/26/21   Dorna Mai, MD  Flaxseed, Linseed, (FLAX SEEDS PO) Take by mouth daily. Patient not taking: Reported on 01/14/2022    [provider]  FLUZONE HIGH-DOSE QUADRIVALENT 0.7 ML SUSY  06/10/21   [provider]  halobetasol (ULTRAVATE) 0.05 % ointment  10/02/19   [provider]  losartan (COZAAR) 25 MG tablet Take 1 tablet (25 mg total) by mouth daily. 02/13/22   Dorna Mai, MD  pantoprazole (PROTONIX) 40 MG tablet Take 1 tablet (40 mg total) by mouth daily. Patient not taking: Reported on 02/13/2022 01/14/22   Mayers, Loraine Grip, PA-C  PFIZER COVID-19 [facility] BIVALENT injection  06/10/21   [provider]  PREVNAR 20 0.5 ML injection  06/10/21   [provider]  sildenafil (VIAGRA) 100 MG tablet TAKE 0.5-1 TABLETS BY MOUTH DAILY AS NEEDED  FOR ERECTILE DYSFUNCTION. 02/13/22   Dorna Mai, MD    Family History Family History  Problem Relation Age of Onset   Colon cancer Neg Hx    Esophageal cancer Neg Hx    Liver cancer Neg Hx    Pancreatic cancer Neg Hx    Rectal cancer Neg Hx    Stomach cancer Neg Hx     Social History Social History   Tobacco Use   Smoking status: Never    Passive exposure: Yes   Smokeless tobacco: Never  Vaping Use   Vaping Use: Never used  Substance Use Topics   Alcohol use: Yes    Alcohol/week: 3.0 standard drinks of alcohol    Types: 3 Standard drinks or equivalent per week   Drug use: Yes    Types: Marijuana, Other-see comments    Comment: had 1 joint yesterday     Allergies   Patient has no known allergies.   Review of Systems Review of Systems  Constitutional:  Negative for chills and fever.  Eyes:  Negative for discharge and redness.  Skin:  Positive for color change and wound.  Neurological:  Negative for numbness.     Physical Exam Triage Vital Signs ED Triage Vitals  Enc Vitals Group     BP      Pulse      Resp      Temp  Temp src      SpO2      Weight      Height      Head Circumference      Peak Flow      Pain Score      Pain Loc      Pain Edu?      Excl. in [place]?    No data found.  Updated Vital Signs There were no vitals taken for this visit.     Physical Exam Vitals and nursing note reviewed.  Constitutional:      General: He is not in acute distress.    Appearance: Normal appearance. He is not ill-appearing.  HENT:     Head: Normocephalic and atraumatic.  Eyes:     Conjunctiva/sclera: Conjunctivae normal.  Cardiovascular:     Rate and Rhythm: Normal rate.  Pulmonary:     Effort: Pulmonary effort is normal.  Neurological:     Mental Status: He is alert.  Psychiatric:        Mood and Affect: Mood normal.        Behavior: Behavior normal.        Thought Content: Thought content normal.      UC Treatments / Results   Labs (all labs ordered are listed, but only abnormal results are displayed) Labs Reviewed - No data to display  EKG   Radiology No results found.  Procedures Procedures (including critical care time)  Medications Ordered in UC Medications - No data to display  Initial Impression / Assessment and Plan / UC Course  I have reviewed the triage vital signs and the nursing notes.  Pertinent labs & imaging results that were available during my care of the patient were reviewed by me and considered in my medical decision making (see chart for details).     *** Final Clinical Impressions(s) / UC Diagnoses   Final diagnoses:  None   Discharge Instructions   None    ED Prescriptions   None    PDMP not reviewed this encounter.

## 2022-05-05 NOTE — Telephone Encounter (Signed)
  Chief Complaint: rash all over and spreading since "spider" bite on right hand 2 weeks ago  Symptoms: right hand swelling. Can see 2 marks on hand from "bite". Looks like pus under marks. Rash to bilateral arms looks like tiny  pimples and itching.  Rash spreading to legs.  Has used hydrocortisone cream with no relief.  Frequency: 2 weeks ago  Pertinent Negatives: Patient denies chest pain no difficulty breathing. No fever Disposition: '[]'$ ED /'[x]'$ Urgent Care (no appt availability in office) / '[]'$ Appointment(In office/virtual)/ '[]'$  [facility]/ '[]'$ Home Care/ '[]'$ Refused Recommended Disposition /'[]'$ [place] [facility]/ '[]'$  Follow-up with PCP Additional Notes:  Recommended UC now .  No available appt .  Reason for Disposition  SEVERE itching (i.e., interferes with sleep, normal activities or school)  Answer Assessment - Initial Assessment Questions 1. APPEARANCE of RASH: "Describe the rash." (e.g., spots, blisters, raised areas, skin peeling, scaly)     Rash all over body tiny pimples. Noted "spider" bite to right hand 2 weeks ago and now rash spreading , itching  2. SIZE: "How big are the spots?" (e.g., tip of pen, eraser, coin; inches, centimeters)     na 3. LOCATION: "Where is the rash located?"     All over. Mostly on bilateral arms , legs  4. COLOR: "What color is the rash?" (Note: It is difficult to assess rash color in people with darker-colored skin. When this situation occurs, simply ask the caller to describe what they see.)     na 5. ONSET: "When did the rash begin?"     2 weeks ago 6. FEVER: "Do you have a fever?" If Yes, ask: "What is your temperature, how was it measured, and when did it start?"     no 7. ITCHING: "Does the rash itch?" If Yes, ask: "How bad is the itch?" (Scale 1-10; or mild, moderate, severe)     Yes  8. CAUSE: "What do you think is causing the rash?"     Not sure  9. MEDICINE FACTORS: "Have you started any new medicines within the last 2 weeks?"  (e.g., antibiotics)      na 10. OTHER SYMPTOMS: "Do you have any other symptoms?" (e.g., dizziness, headache, sore throat, joint pain)       Itching , swelling right hand  11. PREGNANCY: "Is there any chance you are pregnant?" "When was your last menstrual period?"       na  Protocols used: Rash or Redness - [facility]

## 2022-05-05 NOTE — Discharge Instructions (Signed)
  Start prednisone by mouth tomorrow.   Follow up with PCP if rash is not improving or worsens in any way.

## 2022-05-05 NOTE — Telephone Encounter (Signed)
I called pt back because he got disconnected from the agent due to a malfunction in [employer].    He gave me the needed information to call pt back regarding a spider bite.    I attempted 3 times to call pt back.   It would ring and ring then it sounded like he was answering because he would say "Hello".   Then the machine would say the mailbox was full and not accepting messages at this time".   The "Hello" was on the recording, not him answering the phone after I attempted 2 more times to contact him.   Unable to leave a voicemail.

## 2022-05-05 NOTE — ED Triage Notes (Signed)
Pt presents with rash all over body from spider bite X 2 weeks ago.

## 2022-05-06 ENCOUNTER — Encounter: Payer: Self-pay | Admitting: Physician Assistant

## 2022-05-08 ENCOUNTER — Telehealth: Payer: Self-pay

## 2022-05-08 NOTE — Patient Outreach (Signed)
  Care Coordination   05/08/2022 Name: Bradley Thompson MRN: [id number] DOB: [birth year]   Care Coordination Outreach Attempts:  An unsuccessful telephone outreach was attempted today to offer the patient information about available care coordination services as a benefit of their health plan.   Follow Up Plan:  Additional outreach attempts will be made to offer the patient care coordination information and services.   Encounter Outcome:  No Answer  Care Coordination Interventions Activated:  No   Care Coordination Interventions:  No, not indicated    Jone Baseman, RN, MSN [facility] Care Management Care Management Coordinator Direct Line [phone]

## 2022-05-14 ENCOUNTER — Ambulatory Visit: Payer: Medicare Other | Admitting: Family Medicine

## 2022-05-23 ENCOUNTER — Telehealth: Payer: Self-pay

## 2022-05-23 NOTE — Patient Outreach (Signed)
  Care Coordination   05/23/2022 Name: RENDELL THIVIERGE MRN: [id number] DOB: 1946/04/06   Care Coordination Outreach Attempts:  A second unsuccessful outreach was attempted today to offer the patient with information about available care coordination services as a benefit of their health plan.     Follow Up Plan:  Additional outreach attempts will be made to offer the patient care coordination information and services.   Encounter Outcome:  No Answer  Care Coordination Interventions Activated:  No   Care Coordination Interventions:  No, not indicated    Jone Baseman, RN, MSN [facility] Care Management Care Management Coordinator Direct Line [phone]

## 2022-05-24 ENCOUNTER — Other Ambulatory Visit: Payer: Self-pay | Admitting: Physician Assistant

## 2022-05-24 DIAGNOSIS — R1013 Epigastric pain: Secondary | ICD-10-CM

## 2022-05-24 DIAGNOSIS — K219 Gastro-esophageal reflux disease without esophagitis: Secondary | ICD-10-CM

## 2022-05-26 NOTE — Telephone Encounter (Signed)
Requested medication (s) are due for refill today: yes  Requested medication (s) are on the active medication list: yes  Last refill:  01/14/22  Future visit scheduled:no  Notes to clinic:  Unable to refill per protocol, Rx is not assigned to protocol.      Requested Prescriptions  Pending Prescriptions Disp Refills   pantoprazole (PROTONIX) 40 MG tablet [Pharmacy Med Name: PANTOPRAZOLE SOD DR 40 MG TAB] 30 tablet 3    Sig: TAKE 1 TABLET BY MOUTH EVERY DAY     There is no refill protocol information for this order

## 2022-06-10 ENCOUNTER — Telehealth: Payer: Self-pay

## 2022-06-10 NOTE — Patient Outreach (Signed)
  Care Coordination   06/10/2022 Name: Bradley Thompson MRN: [id number] DOB: [birth year]   Care Coordination Outreach Attempts:  A third unsuccessful outreach was attempted today to offer the patient with information about available care coordination services as a benefit of their health plan.   Follow Up Plan:  No further outreach attempts will be made at this time. We have been unable to contact the patient to offer or enroll patient in care coordination services  Encounter Outcome:  No Answer  Care Coordination Interventions Activated:  No   Care Coordination Interventions:  No, not indicated    Jone Baseman, RN, MSN [facility] Care Management Coordinator Direct Line [phone]

## 2022-07-14 ENCOUNTER — Telehealth: Payer: Self-pay | Admitting: Family Medicine

## 2022-07-14 NOTE — Telephone Encounter (Signed)
Left message for patient to call back and schedule Medicare Annual Wellness Visit (AWV) either virtually or phone . Left  my Bradley Thompson number [phone]   Last AWV 06/09/14 please schedule with Nurse Health Adviser   45 min for awv-i and in office appointments 30 min for awv-s  phone/virtual appointments

## 2022-07-16 ENCOUNTER — Telehealth: Payer: Self-pay

## 2022-07-16 LAB — HEMOGLOBIN A1C: Hemoglobin A1C: 7.7

## 2022-07-16 NOTE — Telephone Encounter (Signed)
NP reporting 7.7 %Hgb A1C called in by [facility] NP from  [place] . Routing to PCP office.

## 2022-07-17 NOTE — Telephone Encounter (Signed)
Noted/OV has been schedule

## 2022-07-31 ENCOUNTER — Encounter: Payer: Self-pay | Admitting: Family Medicine

## 2022-07-31 NOTE — Progress Notes (Signed)
House call results abstract entered

## 2022-08-15 ENCOUNTER — Ambulatory Visit (INDEPENDENT_AMBULATORY_CARE_PROVIDER_SITE_OTHER): Payer: Medicare Other | Admitting: Family Medicine

## 2022-08-15 ENCOUNTER — Encounter: Payer: Self-pay | Admitting: Family Medicine

## 2022-08-15 VITALS — BP 133/75 | HR 74 | Temp 98.1°F | Resp 16 | Ht 67.0 in | Wt 173.0 lb

## 2022-08-15 DIAGNOSIS — Z0000 Encounter for general adult medical examination without abnormal findings: Secondary | ICD-10-CM | POA: Diagnosis not present

## 2022-08-15 DIAGNOSIS — Z13220 Encounter for screening for lipoid disorders: Secondary | ICD-10-CM | POA: Diagnosis not present

## 2022-08-15 DIAGNOSIS — Z130 Encounter for screening for diseases of the blood and blood-forming organs and certain disorders involving the immune mechanism: Secondary | ICD-10-CM | POA: Diagnosis not present

## 2022-08-15 NOTE — Progress Notes (Signed)
Subjective:   Bradley Thompson is a 77 y.o. male who presents for Medicare Annual/Subsequent preventive examination.  Review of Systems Defer to pcp       Objective:    Today's Vitals   08/15/22 0837  BP: 133/75  Pulse: 74  Resp: 16  Temp: 98.1 F (36.7 C)  TempSrc: Oral  Weight: 173 lb (78.5 kg)  Height: '5\' 7"'$  (1.702 m)   Body mass index is 27.1 kg/m.     08/15/2022    8:42 AM  Advanced Directives  Does Patient Have a Medical Advance Directive? No  Would patient like information on creating a medical advance directive? No - Patient declined    Current Medications (verified) Outpatient Encounter Medications as of 08/15/2022  Medication Sig   losartan (COZAAR) 25 MG tablet Take 1 tablet (25 mg total) by mouth daily.   sildenafil (VIAGRA) 100 MG tablet TAKE 0.5-1 TABLETS BY MOUTH DAILY AS NEEDED FOR ERECTILE DYSFUNCTION.   BINAXNOW COVID-19 AG HOME TEST KIT See admin instructions. (Patient not taking: Reported on 01/14/2022)   doxycycline (VIBRA-TABS) 100 MG tablet Take 1 tablet (100 mg total) by mouth 2 (two) times daily. (Patient not taking: Reported on 01/14/2022)   Flaxseed, Linseed, (FLAX SEEDS PO) Take by mouth daily. (Patient not taking: Reported on 01/14/2022)   FLUZONE HIGH-DOSE QUADRIVALENT 0.7 ML SUSY  (Patient not taking: Reported on 01/14/2022)   halobetasol (ULTRAVATE) 0.05 % ointment  (Patient not taking: Reported on 01/14/2022)   pantoprazole (PROTONIX) 40 MG tablet Take 1 tablet (40 mg total) by mouth daily. (Patient not taking: Reported on 02/13/2022)   PFIZER COVID-19 VAC BIVALENT injection  (Patient not taking: Reported on 01/14/2022)   PREVNAR 20 0.5 ML injection  (Patient not taking: Reported on 01/14/2022)   Facility-Administered Encounter Medications as of 08/15/2022  Medication   0.9 %  sodium chloride infusion    Allergies (verified) Patient has no known allergies.   History: Past Medical History:  Diagnosis Date   Helicobacter pylori (H. pylori)  infection 11/09/2017   History of colon polyps    HYPERLIPIDEMIA 12/21/2008   Past Surgical History:  Procedure Laterality Date   COLONOSCOPY     ROTATOR CUFF REPAIR     Dr. 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 Marland Kitchen  married '68 - 20 yrs/widowed; serial monogomy. 2 sons - '72, '82:  Oldest son taught in [place] for several years. He lives [place]; younger son lives in [place]; 1 grandson.. retired '07 - worked [employer]. Remains sexually active - uses barrier protection.   Social Determinants of Health   Financial Resource Strain: Not on file  Food Insecurity: Not on file  Transportation Needs: Not on file  Physical Activity: Not on file  Stress: Not on file  Social Connections: Not on file    Tobacco Counseling Counseling given: Not Answered   Clinical Intake:  Pre-visit preparation completed: No  Pain : No/denies pain     Diabetes: No     Diabetic?n/a  Interpreter Needed?: No      Activities of Daily Living     No data to  display           Patient Care Team: Dorna Mai, MD as PCP - General (Family Medicine) Lavonna Monarch, MD (Inactive) as Consulting Physician (Dermatology)  Indicate any recent Medical Services you may have received from other than [facility] providers in the past year (date may be approximate).     Assessment:   This is a routine wellness examination for Bradley Thompson.  Hearing/Vision screen No results found.  Dietary issues and exercise activities discussed:     Goals Addressed   None   Depression Screen    08/15/2022    8:43 AM 02/13/2022    9:19 AM 01/14/2022    9:36 AM 04/17/2021    9:15 AM  PHQ 2/9 Scores  PHQ - 2 Score 0 0 3 0  PHQ- 9 Score 0 0 13 0    Fall Risk    08/15/2022    8:44 AM 01/14/2022    9:33 AM  [place] in the past year? 0 0  Number falls in past yr: 0 0  Injury with Fall? 0 0    FALL RISK PREVENTION PERTAINING TO THE HOME:  Any stairs in or around the home? Yes  If so, are there any without handrails? No  Home free of loose throw rugs in walkways, pet beds, electrical cords, etc? No  Adequate lighting in your home to reduce risk of falls? Yes   ASSISTIVE DEVICES UTILIZED TO PREVENT FALLS:  Life alert? No  Use of a cane, walker or w/c? No  Grab bars in the bathroom? Yes  Shower chair or bench in shower? No  Elevated toilet seat or a handicapped toilet? No   TIMED UP AND GO:  Was the test performed? No .  Length of time to ambulate 10 feet: n/a sec.   Gait steady and fast without use of assistive device  Cognitive Function:        Immunizations Immunization History  Administered Date(s) Administered   Fluad Quad(high Dose 65+) 06/18/2022   Influenza, High Dose Seasonal PF 06/16/2019   Pfizer Covid-19 Vaccine Bivalent Booster 34yr & up 06/18/2022   Td 12/21/2008    TDAP status: Due, Education has been provided regarding the importance of this vaccine. Advised may receive this vaccine at local pharmacy or Health Dept.  Aware to provide a copy of the vaccination record if obtained from local pharmacy or Health Dept. Verbalized acceptance and understanding.  Flu Vaccine status: Up to date  Pneumococcal vaccine status: Up to date  Covid-19 vaccine status: Completed vaccines  Qualifies for Shingles Vaccine? Yes   Zostavax completed Yes   Shingrix Completed?: No.  Education has been provided regarding the importance of this vaccine. Patient has been advised to call insurance company to determine out of pocket expense if they have not yet received this vaccine. Advised may also receive vaccine at local pharmacy or Health Dept. Verbalized acceptance and understanding.  Screening Tests Health Maintenance  Topic Date Due   Medicare Annual Wellness (AWV)  Never done   COVID-19 Vaccine (2 - 2023-24 season) 08/13/2022   Pneumonia Vaccine 27+ Years old (1 - PCV) 11/27/2022 (Originally 04/25/2012)   COLONOSCOPY (Pts 45-34yr Insurance coverage will need to be confirmed)  01/09/2023   INFLUENZA VACCINE  Completed   Hepatitis C Screening  Completed   HPV VACCINES  Aged Out   DTaP/Tdap/Td  Discontinued   Zoster Vaccines- Shingrix  Discontinued    Health Maintenance  Health Maintenance Due  Topic Date Due   Medicare Annual Wellness (AWV)  Never done   COVID-19 Vaccine (2 - 2023-24 season) 08/13/2022    Colorectal cancer screening: Type of screening: Colonoscopy. Completed 01/08/2018. Repeat every 5 years  Lung Cancer Screening: (Low Dose CT Chest recommended if Age 76-80years, 30 pack-year currently smoking OR have quit w/in 15years.) does not qualify.   Lung Cancer Screening Referral: n/a  Additional Screening:  Hepatitis C Screening: does qualify; Completed 04/17/2021  Vision Screening: Recommended annual ophthalmology exams for early detection of glaucoma and other disorders of the eye. Is the patient up to date with their annual eye exam?  No  Who is the provider or what is the name of the office  in which the patient attends annual eye exams? N/a If pt is not established with a provider, would they like to be referred to a provider to establish care? No .   Dental Screening: Recommended annual dental exams for proper oral hygiene  Community Resource Referral / Chronic Care Management: CRR required this visit?  No   CCM required this visit?  No      Plan:     I have personally reviewed and noted the following in the patient's chart:   Medical and social history Use of alcohol, tobacco or illicit drugs  Current medications and supplements including opioid prescriptions. Patient is not currently taking opioid prescriptions. Functional ability and status Nutritional status Physical activity Advanced directives List of other physicians Hospitalizations, surgeries, and ER visits in previous 12 months Vitals Screenings to include cognitive, depression, and falls Referrals and appointments  In addition, I have reviewed and discussed with patient certain preventive protocols, quality metrics, and best practice recommendations. A written personalized care plan for preventive services as well as general preventive health recommendations were provided to patient.     AMelene Plan RMA   08/15/2022   Nurse Notes:

## 2022-08-16 LAB — CMP14+EGFR
ALT: 25 IU/L (ref 0–44)
AST: 22 IU/L (ref 0–40)
Albumin/Globulin Ratio: 2 (ref 1.2–2.2)
Albumin: 4.7 g/dL (ref 3.8–4.8)
Alkaline Phosphatase: 72 IU/L (ref 44–121)
BUN/Creatinine Ratio: 8 — ABNORMAL LOW (ref 10–24)
BUN: 8 mg/dL (ref 8–27)
Bilirubin Total: 0.6 mg/dL (ref 0.0–1.2)
CO2: 25 mmol/L (ref 20–29)
Calcium: 9.4 mg/dL (ref 8.6–10.2)
Chloride: 102 mmol/L (ref 96–106)
Creatinine, Ser: 0.95 mg/dL (ref 0.76–1.27)
Globulin, Total: 2.3 g/dL (ref 1.5–4.5)
Glucose: 117 mg/dL — ABNORMAL HIGH (ref 70–99)
Potassium: 3.9 mmol/L (ref 3.5–5.2)
Sodium: 142 mmol/L (ref 134–144)
Total Protein: 7 g/dL (ref 6.0–8.5)
eGFR: 83 mL/min/{1.73_m2} (ref >59)

## 2022-08-16 LAB — CBC WITH DIFFERENTIAL/PLATELET
Basophils Absolute: 0.1 10*3/uL (ref 0.0–0.2)
Basos: 1 %
EOS (ABSOLUTE): 0.3 10*3/uL (ref 0.0–0.4)
Eos: 4 %
Hematocrit: 51.1 % — ABNORMAL HIGH (ref 37.5–51.0)
Hemoglobin: 17.1 g/dL (ref 13.0–17.7)
Immature Grans (Abs): 0 10*3/uL (ref 0.0–0.1)
Immature Granulocytes: 0 %
Lymphocytes Absolute: 1.4 10*3/uL (ref 0.7–3.1)
Lymphs: 22 %
MCH: 28.4 pg (ref 26.6–33.0)
MCHC: 33.5 g/dL (ref 31.5–35.7)
MCV: 85 fL (ref 79–97)
Monocytes Absolute: 0.6 10*3/uL (ref 0.1–0.9)
Monocytes: 9 %
Neutrophils Absolute: 4 10*3/uL (ref 1.4–7.0)
Neutrophils: 64 %
Platelets: 204 10*3/uL (ref 150–450)
RBC: 6.03 x10E6/uL — ABNORMAL HIGH (ref 4.14–5.80)
RDW: 15.1 % (ref 11.6–15.4)
WBC: 6.2 10*3/uL (ref 3.4–10.8)

## 2022-08-16 LAB — LIPID PANEL
Chol/HDL Ratio: 3.6 ratio (ref 0.0–5.0)
Cholesterol, Total: 216 mg/dL — ABNORMAL HIGH (ref 100–199)
HDL: 60 mg/dL (ref >39)
LDL Chol Calc (NIH): 144 mg/dL — ABNORMAL HIGH (ref 0–99)
Triglycerides: 68 mg/dL (ref 0–149)
VLDL Cholesterol Cal: 12 mg/dL (ref 5–40)

## 2022-08-18 ENCOUNTER — Encounter: Payer: Self-pay | Admitting: Family Medicine

## 2022-08-18 NOTE — Progress Notes (Signed)
Established Patient Office Visit  Subjective    Patient ID: Bradley Thompson, male    DOB: [birth year]  Age: 77 y.o. MRN: [id number]  CC:  Chief Complaint  Patient presents with   Annual Exam   Medicare Wellness    HPI [name] presents for routine annual exam. Patient denies acute complaints or concerns.    Outpatient Encounter Medications as of 08/15/2022  Medication Sig   losartan (COZAAR) 25 MG tablet Take 1 tablet (25 mg total) by mouth daily.   sildenafil (VIAGRA) 100 MG tablet TAKE 0.5-1 TABLETS BY MOUTH DAILY AS NEEDED FOR ERECTILE DYSFUNCTION.   BINAXNOW COVID-19 AG HOME TEST KIT See admin instructions. (Patient not taking: Reported on 01/14/2022)   doxycycline (VIBRA-TABS) 100 MG tablet Take 1 tablet (100 mg total) by mouth 2 (two) times daily. (Patient not taking: Reported on 01/14/2022)   Flaxseed, Linseed, (FLAX SEEDS PO) Take by mouth daily. (Patient not taking: Reported on 01/14/2022)   FLUZONE HIGH-DOSE QUADRIVALENT 0.7 ML SUSY  (Patient not taking: Reported on 01/14/2022)   halobetasol (ULTRAVATE) 0.05 % ointment  (Patient not taking: Reported on 01/14/2022)   pantoprazole (PROTONIX) 40 MG tablet Take 1 tablet (40 mg total) by mouth daily. (Patient not taking: Reported on 02/13/2022)   PFIZER COVID-19 VAC BIVALENT injection  (Patient not taking: Reported on 01/14/2022)   PREVNAR 20 0.5 ML injection  (Patient not taking: Reported on 01/14/2022)   Facility-Administered Encounter Medications as of 08/15/2022  Medication   0.9 %  sodium chloride infusion    Past Medical History:  Diagnosis Date   Helicobacter pylori (H. pylori) infection 11/09/2017   History of colon polyps    HYPERLIPIDEMIA 12/21/2008    Past Surgical History:  Procedure Laterality Date   COLONOSCOPY     ROTATOR CUFF REPAIR     Dr. 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 . married '68 - 20 yrs/widowed; serial monogomy. 2 sons - '72, '82:  Oldest son taught in [place] for several years. He lives [place]; younger son lives in [place]; 1 grandson.. retired '07 - worked [employer]. Remains sexually active - uses barrier protection.   Social Determinants of Health   Financial Resource Strain: Not on file  Food Insecurity: Not on file  Transportation Needs: Not on file  Physical Activity: Not on file  Stress: Not on file  Social Connections: Not on file  Intimate Partner Violence: Not on file    Review of Systems  All other systems reviewed and are negative.       Objective    BP 133/75   Pulse 74   Temp 98.1 F (36.7 C) (Oral)   Resp 16   Ht '5\' 7"'$  (1.702 m)   Wt 173 lb (78.5 kg)   BMI 27.10 kg/m   Physical Exam Vitals and nursing note reviewed.  Constitutional:      General: He is not in acute distress. Cardiovascular:     Rate and Rhythm: Normal rate and regular rhythm.  Pulmonary:     Effort: Pulmonary effort is normal.     Breath sounds: Normal breath sounds.  Abdominal:     Palpations: Abdomen is soft.      Tenderness: There is no abdominal tenderness.  Genitourinary:    Penis: Normal and circumcised.      Testes: Normal.  Neurological:     General: No focal deficit present.     Mental Status: He is alert and oriented to person, place, and time.         Assessment & Plan:   1. Encounter for Medicare annual wellness exam   2. Annual physical exam  - CMP14+EGFR  3. Screening for deficiency anemia  - CBC with Differential  4. Screening for lipid disorders  - Lipid Panel    Return in 1 year (on 08/16/2023).   Becky Sax, MD

## 2022-09-01 ENCOUNTER — Other Ambulatory Visit: Payer: Self-pay | Admitting: Physician Assistant

## 2022-09-01 DIAGNOSIS — R1013 Epigastric pain: Secondary | ICD-10-CM

## 2022-09-01 DIAGNOSIS — K219 Gastro-esophageal reflux disease without esophagitis: Secondary | ICD-10-CM

## 2022-10-03 ENCOUNTER — Other Ambulatory Visit: Payer: Self-pay | Admitting: Family Medicine

## 2022-10-03 DIAGNOSIS — I10 Essential (primary) hypertension: Secondary | ICD-10-CM

## 2023-02-03 ENCOUNTER — Other Ambulatory Visit: Payer: Self-pay | Admitting: Family Medicine

## 2023-02-09 ENCOUNTER — Ambulatory Visit: Payer: Self-pay | Admitting: *Deleted

## 2023-02-09 NOTE — Telephone Encounter (Signed)
Message from [place] C sent at 02/09/2023  3:04 PM EDT  Summary: rx concern   The patient has called regarding their losartan (COZAAR) 25 MG tablet [[id number]] prescription  The patient shares that they are concerned with potential liver concerns with long time use of this medication  The patient would like to discuss further when possible          Call History  Contact Date/Time Type Contact Phone/Fax User  02/09/2023 03:02 PM EDT Phone (Incoming) Xue, Low (Self) [phone] Judie Petit) Coley, Everette A   Reason for Disposition  [1] Caller has URGENT medicine question about med that PCP or specialist prescribed AND [2] triager unable to answer question    Wants to stop losartan due to liver and kidney damage potential.    Also mentioned he has a rash on his arms and hands.  Answer Assessment - Initial Assessment Questions 1. NAME of MEDICINE: "What medicine(s) are you calling about?"     Losartan 2. QUESTION: "What is your question?" (e.g., double dose of medicine, side effect)     I'm worried about this medicine hurting my liver and kidneys.    I had a practitioner at my house and she was telling me about losartan could mess up my kidneys and liver.    I want to talk with Dr. Andrey Campanile about getting off of it.   I'm breaking out on my arms and hands.   Nothing is helping the rash.   I'm thinking something is going on.    I found out you can have liver and kidney problems from losartan while I was looking on line.    Something is going on with me so I need to talk with Dr. Andrey Campanile.       3. PRESCRIBER: "Who prescribed the medicine?" Reason: if prescribed by specialist, call should be referred to that group.     Dr. Andrey Campanile 4. SYMPTOMS: "Do you have any symptoms?" If Yes, ask: "What symptoms are you having?"  "How bad are the symptoms (e.g., mild, moderate, severe)     I've got this rash on my arms and hands.   No kind of lotion is helping it go away.   5. PREGNANCY:  "Is there any chance  that you are pregnant?" "When was your last menstrual period?"     N/A  Protocols used: Medication Question Call-A-AH

## 2023-02-09 NOTE — Telephone Encounter (Signed)
  Chief Complaint: worried about losartan damaging his kidneys and liver.   Wants to stop taking it.    Also mentioned having a rash on both arms and hands. Symptoms: rash on arms and hands. Frequency: now Pertinent Negatives: Patient denies knowing what the rash is from Disposition: [] ED /[] Urgent Care (no appt availability in office) / [x] Appointment(In office/virtual)/ []  [facility]/ [] Home Care/ [] Refused Recommended Disposition /[] [facility]/ []  Follow-up with PCP Additional Notes: He already has an appt set up with Dr. Andrey Campanile for 02/11/2023 at 8:20.

## 2023-02-11 ENCOUNTER — Ambulatory Visit: Payer: Medicare Other | Admitting: Family Medicine

## 2023-02-13 ENCOUNTER — Encounter: Payer: Self-pay | Admitting: Family Medicine

## 2023-02-13 ENCOUNTER — Ambulatory Visit (INDEPENDENT_AMBULATORY_CARE_PROVIDER_SITE_OTHER): Payer: Medicare Other | Admitting: Family Medicine

## 2023-02-13 DIAGNOSIS — N529 Male erectile dysfunction, unspecified: Secondary | ICD-10-CM

## 2023-02-13 DIAGNOSIS — E785 Hyperlipidemia, unspecified: Secondary | ICD-10-CM | POA: Diagnosis not present

## 2023-02-13 DIAGNOSIS — I10 Essential (primary) hypertension: Secondary | ICD-10-CM | POA: Diagnosis not present

## 2023-02-13 MED ORDER — TADALAFIL 20 MG PO TABS: 10.0000 mg | ORAL_TABLET | ORAL | 5 refills | Status: DC | PRN

## 2023-02-13 NOTE — Progress Notes (Unsigned)
Patient is here for their 6 month follow-up Patient has no concerns today Care gaps have been discussed with patient  

## 2023-02-16 ENCOUNTER — Encounter: Payer: Self-pay | Admitting: Family Medicine

## 2023-02-16 NOTE — Progress Notes (Signed)
Established Patient Office Visit  Subjective    Patient ID: Bradley Thompson, male    DOB: 10-29-1945  Age: 77 y.o. MRN: [id number]  CC:  Chief Complaint  Patient presents with   Medication Refill    HPI Bradley Thompson presents for routine follow up of chronic med issues.    Outpatient Encounter Medications as of 02/13/2023  Medication Sig   losartan (COZAAR) 25 MG tablet TAKE 1 TABLET (25 MG TOTAL) BY MOUTH DAILY.   pantoprazole (PROTONIX) 40 MG tablet TAKE 1 TABLET BY MOUTH EVERY DAY   sildenafil (VIAGRA) 100 MG tablet TAKE 0.5-1 TABLETS BY MOUTH DAILY AS NEEDED FOR ERECTILE DYSFUNCTION.   tadalafil (CIALIS) 20 MG tablet Take 0.5-1 tablets (10-20 mg total) by mouth every other day as needed for erectile dysfunction.   BINAXNOW COVID-19 AG HOME TEST KIT See admin instructions. (Patient not taking: Reported on 01/14/2022)   doxycycline (VIBRA-TABS) 100 MG tablet Take 1 tablet (100 mg total) by mouth 2 (two) times daily. (Patient not taking: Reported on 01/14/2022)   Flaxseed, Linseed, (FLAX SEEDS PO) Take by mouth daily. (Patient not taking: Reported on 01/14/2022)   FLUZONE HIGH-DOSE QUADRIVALENT 0.7 ML SUSY  (Patient not taking: Reported on 01/14/2022)   halobetasol (ULTRAVATE) 0.05 % ointment  (Patient not taking: Reported on 01/14/2022)   PFIZER COVID-19 VAC BIVALENT injection  (Patient not taking: Reported on 01/14/2022)   PREVNAR 20 0.5 ML injection  (Patient not taking: Reported on 01/14/2022)   Facility-Administered Encounter Medications as of 02/13/2023  Medication   0.9 %  sodium chloride infusion    Past Medical History:  Diagnosis Date   Helicobacter pylori (H. pylori) infection 11/09/2017   History of colon polyps    HYPERLIPIDEMIA 12/21/2008    Past Surgical History:  Procedure Laterality Date   COLONOSCOPY     ROTATOR CUFF REPAIR     Dr. 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 . married '68 - 20 yrs/widowed; serial monogomy. 2 sons - '72, '82:  Oldest son taught in [place] for several years. He lives [place]; younger son lives in [place]; 1 grandson.. retired '07 - worked [employer]. Remains sexually active - uses barrier protection.   Social Determinants of Health   Financial Resource Strain:  Not on file  Food Insecurity: Not on file  Transportation Needs: Not on file  Physical Activity: Not on file  Stress: Not on file  Social Connections: Not on file  Intimate Partner Violence: Not on file    Review of Systems  All other systems reviewed and are negative.       Objective    BP 112/71   Pulse 69   Temp 97.7 F (36.5 C) (Oral)   Resp 16   Wt 175 lb (79.4 kg)   SpO2 98%   BMI 27.41 kg/m   Physical Exam Vitals and nursing note reviewed.  Constitutional:      General: He is not in acute distress. Cardiovascular:     Rate and Rhythm: Normal rate and regular rhythm.  Pulmonary:     Effort: Pulmonary effort is normal.     Breath sounds: Normal breath sounds.  Abdominal:     Palpations:  Abdomen is soft.     Tenderness: There is no abdominal tenderness.  Musculoskeletal:     Right lower leg: No edema.     Left lower leg: No edema.  Neurological:     General: No focal deficit present.     Mental Status: He is alert and oriented to person, place, and time.         Assessment & Plan:   1. Essential hypertension Appears stable. Continue   2. Hyperlipidemia, unspecified hyperlipidemia type Continue   3. Erectile dysfunction, unspecified erectile dysfunction type Patient wants to change from viagra to cialis. Cialis 20 mg prescribed   No follow-ups on file.   Tommie Raymond, MD

## 2023-02-18 ENCOUNTER — Other Ambulatory Visit: Payer: Self-pay | Admitting: Family Medicine

## 2023-02-18 NOTE — Telephone Encounter (Signed)
Per office visit note from 02/13/2023 patient was prescribed Tadalafil as alternate for Sildenafil from Georganna Skeans, MD.

## 2023-03-02 ENCOUNTER — Telehealth: Payer: Self-pay | Admitting: Family Medicine

## 2023-03-02 NOTE — Telephone Encounter (Signed)
Pt is calling in because he says he cannot afford tadalafil (CIALIS) 20 MG tablet [[id number]] due to it being $500 and ins. Won't cover it. Pt is requesting a prescription for Viagra be sent over because he says he was taking that and had no issues with insurance.

## 2023-03-05 NOTE — Telephone Encounter (Signed)
I have attempted without success to contact this patient by phone to return their call and I left a message on answering machine.

## 2023-03-11 ENCOUNTER — Other Ambulatory Visit: Payer: Self-pay | Admitting: Family Medicine

## 2023-03-11 MED ORDER — SILDENAFIL CITRATE 100 MG PO TABS: ORAL_TABLET | ORAL | 1 refills | Status: DC

## 2023-04-30 ENCOUNTER — Other Ambulatory Visit: Payer: Self-pay | Admitting: Family Medicine

## 2023-04-30 DIAGNOSIS — I10 Essential (primary) hypertension: Secondary | ICD-10-CM

## 2023-04-30 NOTE — Telephone Encounter (Signed)
Requested Prescriptions  Pending Prescriptions Disp Refills   losartan (COZAAR) 25 MG tablet [Pharmacy Med Name: LOSARTAN POTASSIUM 25 MG TAB] 90 tablet 1    Sig: TAKE 1 TABLET (25 MG TOTAL) BY MOUTH DAILY.     Cardiovascular:  Angiotensin Receptor Blockers Failed - 04/30/2023  1:30 AM      Failed - Cr in normal range and within 180 days    Creat  Date Value Ref Range Status  11/15/2019 0.86 0.70 - 1.18 mg/dL Final    Comment:    For patients >37 years of age, the reference limit for Creatinine is approximately 13% higher for people identified as African-American. .    Creatinine, Ser  Date Value Ref Range Status  08/15/2022 0.95 0.76 - 1.27 mg/dL Final         Failed - K in normal range and within 180 days    Potassium  Date Value Ref Range Status  08/15/2022 3.9 3.5 - 5.2 mmol/L Final         Passed - Patient is not pregnant      Passed - Last BP in normal range    BP Readings from Last 1 Encounters:  02/13/23 112/71         Passed - Valid encounter within last 6 months    Recent Outpatient Visits           2 months ago Essential hypertension   [facility], MD   8 months ago Encounter for Medicare annual wellness exam   [facility], MD   1 year ago Essential hypertension   [facility], MD   1 year ago Cellulitis of right ring finger   [facility], MD   2 years ago Visit for well man health check   [facility], MD       Future Appointments             In 3 months Georganna Skeans, MD [facility]

## 2023-05-31 IMAGING — MR MR [PERSON_NAME]*[PERSON_NAME]* WO/W CM
8 series · 40 of 40 positions shown · IV contrast (Multihance 16 ml)
Comparison: Right ring finger x-rays dated December 02, 2021.

CLINICAL DATA: Right ring finger infection.

EXAM:
MRI OF THE RIGHT HAND WITHOUT AND WITH CONTRAST
TECHNIQUE: Multiplanar, multisequence MR imaging of the right ring finger was
performed before and after the administration of intravenous
contrast.
CONTRAST:  16mL MULTIHANCE GADOBENATE DIMEGLUMINE 529 MG/ML IV SOLN

[Series 4: t1_ax · axial · right · 3.0mm · 0.31mm/px · z∈[-20,+86]mm · 7 of 35 slices shown]
[im 1/35]
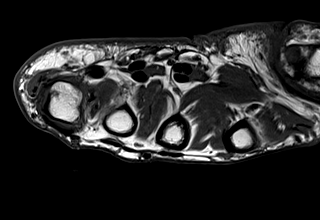
[im 6/35]
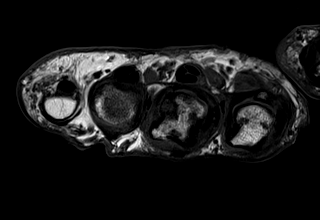
[im 12/35]
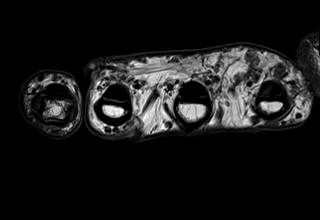
[im 18/35]
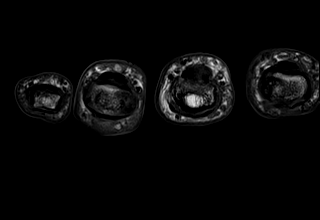
[im 23/35]
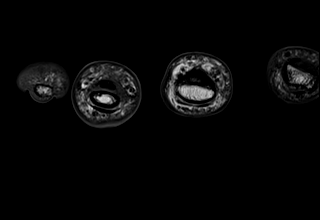
[im 29/35]
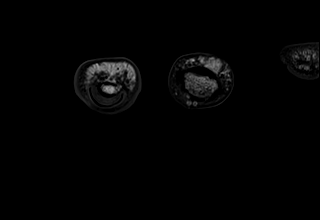
[im 35/35]
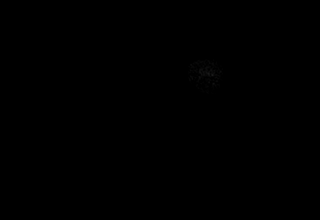

[Series 5: T2 fat-sat · axial · right · 3.0mm · 0.31mm/px · z∈[-20,+87]mm · 7 of 35 slices shown (1 of 2)]
[im 1/35]
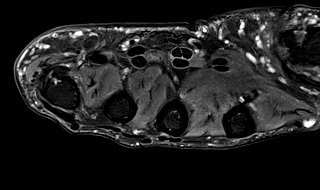
[im 6/35]
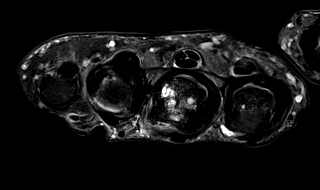
[im 12/35]
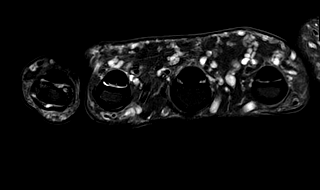
[im 18/35]
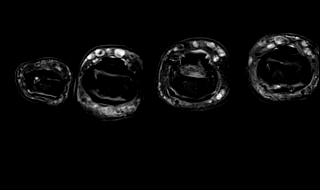
[im 23/35]
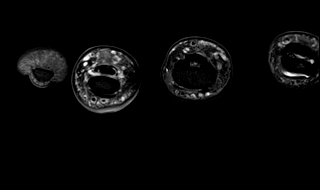
[im 29/35]
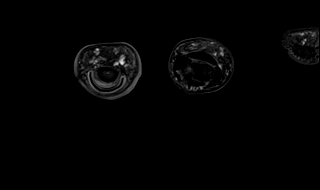
[im 35/35]
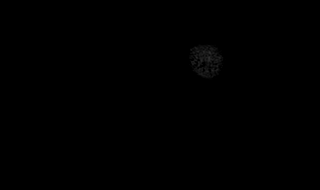

[Series 6: PD fat-sat · sagittal · right · 3.0mm · 0.30mm/px · 3 of 14 slices shown]
[im 1/14]
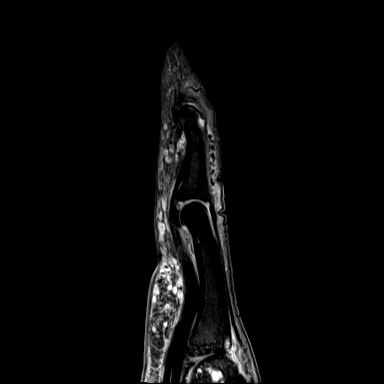
[im 7/14]
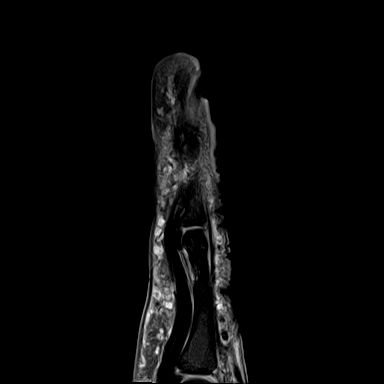
[im 14/14]
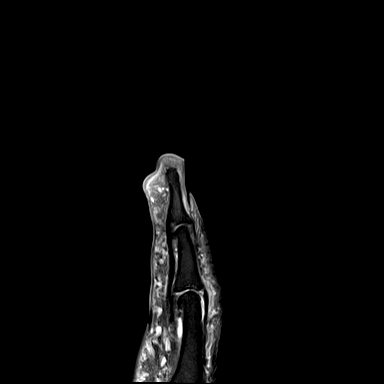

[Series 7: T2 fat-sat · coronal · right · 2.0mm · 0.22mm/px · 3 of 15 slices shown (2 of 2)]
[im 1/15]
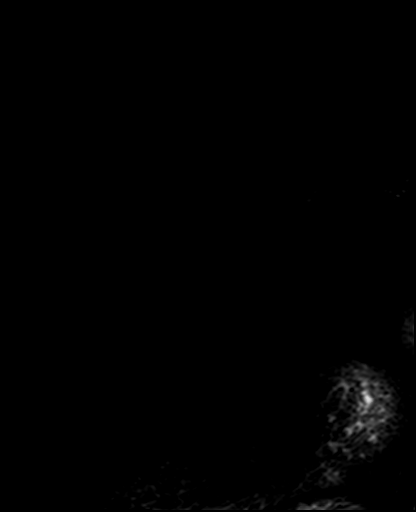
[im 8/15]
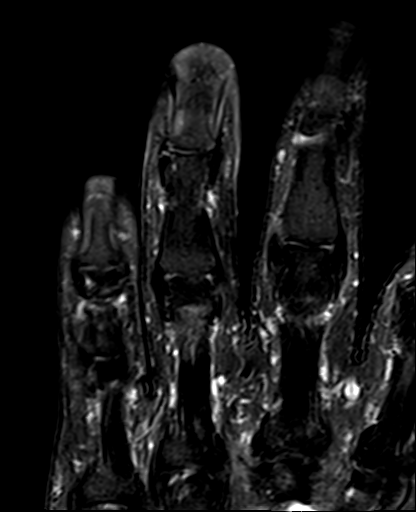
[im 15/15]
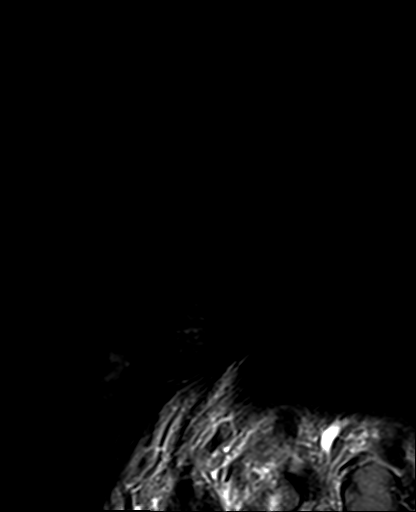

[Series 8: T1 · coronal · right · 2.0mm · 0.49mm/px · 3 of 15 slices shown]
[im 1/15]
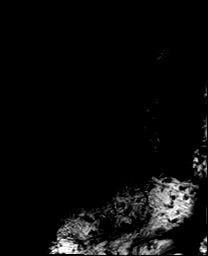
[im 8/15]
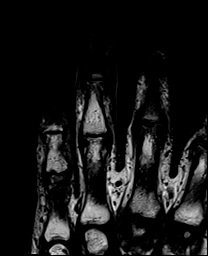
[im 15/15]
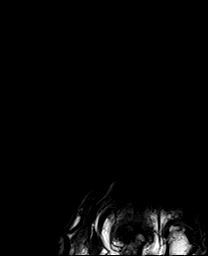

[Series 9: t1_ax fs · axial · right · 3.0mm · 0.31mm/px · z∈[-20,+86]mm · 7 of 35 slices shown (1 of 2)]
[im 1/35]
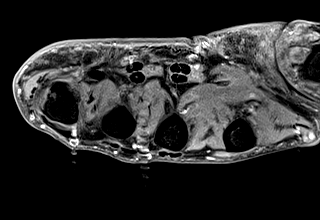
[im 6/35]
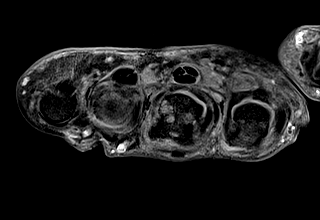
[im 12/35]
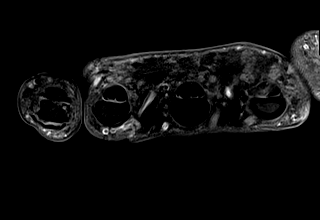
[im 18/35]
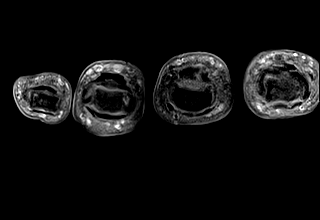
[im 23/35]
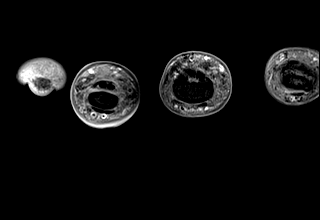
[im 29/35]
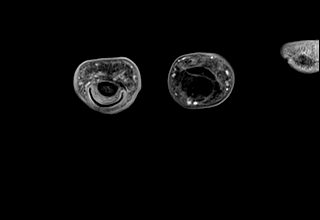
[im 35/35]
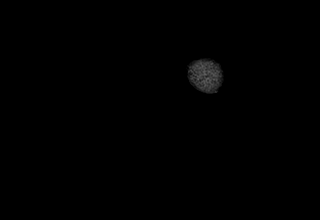

[Series 10: t1_ax fs · axial · right · 3.0mm · 0.31mm/px · z∈[-20,+86]mm · 7 of 35 slices shown (2 of 2)]
[im 1/35]
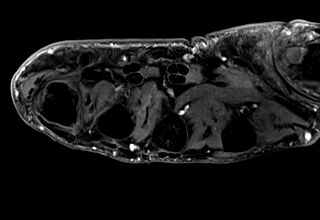
[im 6/35]
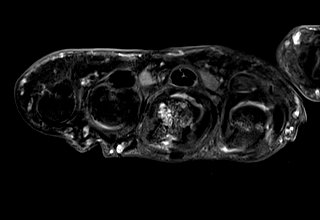
[im 12/35]
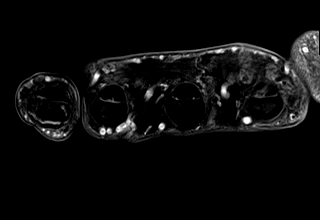
[im 18/35]
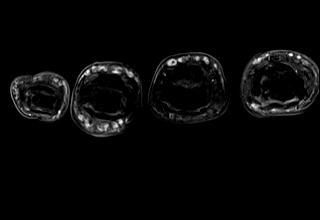
[im 23/35]
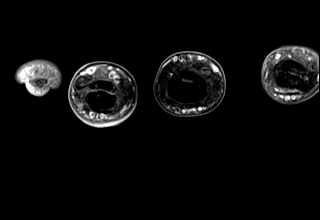
[im 29/35]
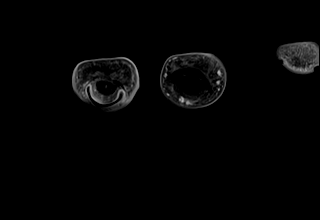
[im 35/35]
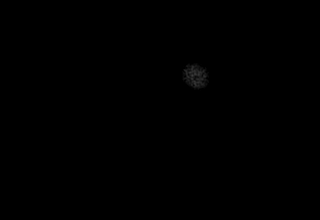

[Series 11: T1 fat-sat · coronal · right · 2.0mm · 0.49mm/px · 3 of 15 slices shown]
[im 1/15]
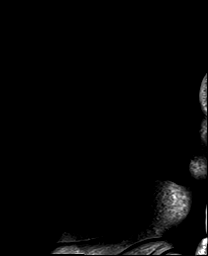
[im 8/15]
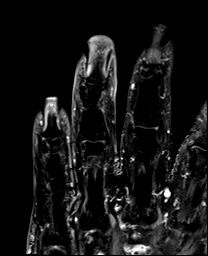
[im 15/15]
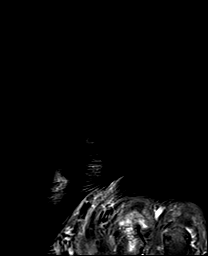

[40 of 40 positions shown; findings below may reference images not displayed]

FINDINGS: Bones/Joint/Cartilage

No marrow signal abnormality. No fracture or dislocation. Severe
third and moderate second MCP joint osteoarthritis with subchondral
cystic change. No joint effusion.

Ligaments

Collateral ligaments are intact.

Muscles and Tendons
Flexor and extensor tendons are intact.

Soft tissue
Circumferential skin thickening and enhancement of the distal ring
finger. No fluid collection. No soft tissue mass.
IMPRESSION: 1. Circumferential skin thickening and enhancement of the distal
ring finger, consistent with cellulitis. No abscess or evidence of
osteomyelitis.

## 2023-08-18 ENCOUNTER — Ambulatory Visit: Payer: Medicare Other | Admitting: Family Medicine

## 2023-08-18 ENCOUNTER — Encounter: Payer: Self-pay | Admitting: Family Medicine

## 2023-10-15 ENCOUNTER — Other Ambulatory Visit: Payer: Self-pay | Admitting: Family Medicine

## 2023-10-22 ENCOUNTER — Ambulatory Visit: Payer: Medicare Other

## 2023-10-22 DIAGNOSIS — Z0000 Encounter for general adult medical examination without abnormal findings: Secondary | ICD-10-CM

## 2023-10-22 NOTE — Progress Notes (Signed)
 Subjective:   Bradley Thompson is a 78 y.o. who presents for a Medicare Wellness preventive visit.  Visit Complete: Virtual I connected with  Ether Griffins on 10/22/23 by a audio enabled telemedicine application and verified that I am speaking with the correct person using two identifiers.  Patient Location: Home  Provider Location: Home Office  I discussed the limitations of evaluation and management by telemedicine. The patient expressed understanding and agreed to proceed.  Vital Signs: Because this visit was a virtual/telehealth visit, some criteria may be missing or patient reported. Any vitals not documented were not able to be obtained and vitals that have been documented are patient reported.  VideoError- Librarian, academic were attempted between this provider and patient, however failed, due to patient having technical difficulties OR patient did not have access to video capability.  We continued and completed visit with audio only.   Persons Participating in Visit: Patient.  AWV Questionnaire: No: Patient Medicare AWV questionnaire was not completed prior to this visit.  Cardiac Risk Factors include: advanced age (>29men, >46 women);dyslipidemia;male gender     Objective:    Today's Vitals   There is no height or weight on file to calculate BMI.     10/22/2023   10:18 AM 08/15/2022    8:42 AM  Advanced Directives  Does Patient Have a Medical Advance Directive? No No  Would patient like information on creating a medical advance directive?  No - Patient declined    Current Medications (verified) Outpatient Encounter Medications as of 10/22/2023  Medication Sig   losartan (COZAAR) 25 MG tablet TAKE 1 TABLET (25 MG TOTAL) BY MOUTH DAILY.   sildenafil (VIAGRA) 100 MG tablet TAKE 1/2 - 1 TABLET BY MOUTH DAILY AS NEEDED FOR ERECTILE DYSFUNCTION   BINAXNOW COVID-19 AG HOME TEST KIT See admin instructions. (Patient not taking: Reported on  10/22/2023)   doxycycline (VIBRA-TABS) 100 MG tablet Take 1 tablet (100 mg total) by mouth 2 (two) times daily. (Patient not taking: Reported on 01/14/2022)   Flaxseed, Linseed, (FLAX SEEDS PO) Take by mouth daily. (Patient not taking: Reported on 10/22/2023)   FLUZONE HIGH-DOSE QUADRIVALENT 0.7 ML SUSY  (Patient not taking: Reported on 10/22/2023)   halobetasol (ULTRAVATE) 0.05 % ointment  (Patient not taking: Reported on 10/22/2023)   pantoprazole (PROTONIX) 40 MG tablet TAKE 1 TABLET BY MOUTH EVERY DAY   PFIZER COVID-19 VAC BIVALENT injection  (Patient not taking: Reported on 10/22/2023)   PREVNAR 20 0.5 ML injection  (Patient not taking: Reported on 10/22/2023)   tadalafil (CIALIS) 20 MG tablet Take 0.5-1 tablets (10-20 mg total) by mouth every other day as needed for erectile dysfunction. (Patient not taking: Reported on 10/22/2023)   Facility-Administered Encounter Medications as of 10/22/2023  Medication   0.9 %  sodium chloride infusion    Allergies (verified) Patient has no known allergies.   History: Past Medical History:  Diagnosis Date   Helicobacter pylori (H. pylori) infection 11/09/2017   History of colon polyps    HYPERLIPIDEMIA 12/21/2008   Past Surgical History:  Procedure Laterality Date   COLONOSCOPY     ROTATOR CUFF REPAIR     Dr. 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 . married '68 - 20 yrs/widowed; serial monogomy. 2 sons - '72, '82:  Oldest son taught in [place] for several years. He lives [place]; younger son lives in [place]; 1 grandson.. retired '07 - worked [employer]. Remains sexually active - uses barrier protection.   Social Drivers of Corporate investment banker Strain: Low Risk  (10/22/2023)   Overall Financial Resource Strain (CARDIA)    Difficulty of Paying Living Expenses: Not hard at all  Food Insecurity: No Food Insecurity (10/22/2023)   Hunger Vital Sign    Worried About Running Out of Food in the Last Year: Never true    Ran Out of Food in the Last Year: Never true  Transportation Needs: No Transportation Needs (10/22/2023)   PRAPARE - Administrator, Civil Service (Medical): No    Lack of Transportation (Non-Medical): No  Physical Activity: Insufficiently Active (10/22/2023)   Exercise Vital Sign    Days of Exercise per Week: 4 days    Minutes of Exercise per Session: 30 min  Stress: No Stress Concern Present (10/22/2023)   [employer] of Occupational Health - Occupational Stress Questionnaire    Feeling of Stress : Not at all  Social Connections: Moderately Isolated (10/22/2023)   Social Connection and Isolation Panel [NHANES]    Frequency of Communication with Friends and Family: More than three times a week    Frequency of Social Gatherings with Friends and Family: More than three times a week    Attends Religious Services: More than 4 times per year    Active Member of [employer] or Organizations: No    Attends Banker Meetings: Never    Marital Status: Widowed     Tobacco Counseling Counseling given: Not Answered    Clinical Intake:  Pre-visit preparation completed: Yes  Pain : No/denies pain     Nutritional Risks: None Diabetes: No  Lab Results  Component Value Date   HGBA1C 7.7 07/16/2022     How often do you need to have someone help you when you read instructions, pamphlets, or other written materials from your doctor or pharmacy?: 1 - Never  Interpreter Needed?: No  Information entered by :: NAllen LPN   Activities of Daily Living     10/22/2023   10:12 AM  In your present state of health, do you have any difficulty performing the following activities:  Hearing? 0  Vision? 0  Difficulty concentrating or making decisions? 0  Walking or climbing stairs? 0  Dressing or bathing? 0  Doing errands, shopping? 0  Preparing  Food and eating ? N  Using the Toilet? N  In the past six months, have you accidently leaked urine? N  Do you have problems with loss of bowel control? N  Managing your Medications? N  Managing your Finances? N  Housekeeping or managing your Housekeeping? N    Patient Care Team: Georganna Skeans, MD as PCP - General (Family Medicine) Janalyn Harder, MD (Inactive) as Consulting Physician (Dermatology)  Indicate any recent Medical Services you may have received from other than [facility] providers in the past year (date may be approximate).     Assessment:   This is a routine wellness examination for Bradley Thompson.  Hearing/Vision screen Hearing Screening - Comments:: Denies hearing issues Vision Screening - Comments:: No regular eye exams   Goals Addressed             This Visit's Progress    Patient Stated       10/22/2023, to live and be as healthy as he can       Depression Screen     10/22/2023   10:21 AM 02/13/2023    9:27 AM 08/15/2022    8:43 AM 02/13/2022    9:19 AM 01/14/2022    9:36 AM 04/17/2021    9:15 AM  PHQ 2/9 Scores  PHQ - 2 Score 0 0 0 0 3 0  PHQ- 9 Score 0 0 0 0 13 0     Fall Risk     10/22/2023   10:20 AM 08/15/2022    8:44 AM 01/14/2022    9:33 AM  Fall Risk   Falls in the past year? 0 0 0  Number falls in past yr: 0 0 0  Injury with Fall? 0 0 0  Risk for fall due to : Medication side effect    Follow up Falls prevention discussed;Falls evaluation completed      MEDICARE RISK AT HOME:  Medicare Risk at Home Any stairs in or around the home?: Yes If so, are there any without handrails?: No Home free of loose throw rugs in walkways, pet beds, electrical cords, etc?: Yes Adequate lighting in your home to reduce risk of falls?: Yes Life alert?: No Use of a cane, walker or w/c?: No Grab bars in the bathroom?: No Shower chair or bench in shower?: Yes Elevated toilet seat or a handicapped toilet?: Yes  TIMED UP AND GO:  Was the test performed?  No  Cognitive Function: 6CIT completed        10/22/2023   10:21 AM  6CIT Screen  What Year? 0 points  What month? 0 points  What time? 0 points  Count back from 20 0 points  Months in reverse 0 points  Repeat phrase 0 points  Total Score 0 points    Immunizations Immunization History  Administered Date(s) Administered   Fluad Quad(high Dose 65+) 06/18/2022   Influenza, High Dose Seasonal PF 06/16/2019   Influenza-Unspecified 04/14/2023   PNEUMOCOCCAL CONJUGATE-20 06/10/2021   Pfizer Covid-19 Vaccine Bivalent Booster 49yrs & up 06/18/2022   Td 12/21/2008   Unspecified SARS-COV-2 Vaccination 04/14/2023   Zoster Recombinant(Shingrix) 09/09/2021, 12/02/2021    Screening Tests Health Maintenance  Topic Date Due   Colonoscopy  01/09/2023   COVID-19 Vaccine (3 - 2024-25 season) 10/12/2023   Medicare Annual Wellness (AWV)  10/21/2024   Pneumonia Vaccine 22+ Years old  Completed   INFLUENZA VACCINE  Completed   Hepatitis C Screening  Completed   HPV VACCINES  Aged Out   DTaP/Tdap/Td  Discontinued   Zoster Vaccines- Shingrix  Discontinued    Health Maintenance  Health Maintenance Due   Topic Date Due   Colonoscopy  01/09/2023   COVID-19 Vaccine (3 - 2024-25 season) 10/12/2023   Health Maintenance Items Addressed: Request colonoscopy  Additional Screening:  Vision Screening: Recommended annual ophthalmology exams for early detection of glaucoma and other disorders of the eye.  Dental Screening: Recommended annual dental exams for proper oral hygiene  Community Resource Referral / Chronic Care Management: CRR required this visit?  No   CCM required this visit?  No     Plan:     I have personally reviewed and noted the following in the patient's chart:   Medical and social history Use of alcohol, tobacco or illicit drugs  Current medications and supplements including opioid prescriptions. Patient is not currently taking opioid prescriptions. Functional ability and status Nutritional status Physical activity Advanced directives List of other physicians Hospitalizations, surgeries, and ER visits in previous 12 months Vitals Screenings to include cognitive, depression, and falls Referrals and appointments  In addition, I have reviewed and discussed with patient certain preventive protocols, quality metrics, and best practice recommendations. A written personalized care plan for preventive services as well as general preventive health recommendations were provided to patient.     Barb Merino, LPN   12/23/4130   After Visit Summary: (Pick Up) Due to this being a telephonic visit, with patients personalized plan was offered to patient and patient has requested to Pick up at office.  Notes: Nothing significant to report at this time.

## 2023-10-22 NOTE — Patient Instructions (Signed)
 Bradley Thompson , Thank you for taking time to come for your Medicare Wellness Visit. I appreciate your ongoing commitment to your health goals. Please review the following plan we discussed and let me know if I can assist you in the future.   Referrals/Orders/Follow-Ups/Clinician Recommendations: none  This is a list of the screening recommended for you and due dates:  Health Maintenance  Topic Date Due   Colon Cancer Screening  01/09/2023   COVID-19 Vaccine (3 - 2024-25 season) 10/12/2023   Medicare Annual Wellness Visit  10/21/2024   Pneumonia Vaccine  Completed   Flu Shot  Completed   Hepatitis C Screening  Completed   HPV Vaccine  Aged Out   DTaP/Tdap/Td vaccine  Discontinued   Zoster (Shingles) Vaccine  Discontinued    Advanced directives: (ACP Link)Information on Advanced Care Planning can be found at [facility] [place] Advance Health Care Directives Advance Health Care Directives. [email or web address]   Next Medicare Annual Wellness Visit scheduled for next year: Yes  insert Preventive Care attachment Insert FALL PREVENTION attachment if needed

## 2023-11-30 ENCOUNTER — Ambulatory Visit: Payer: Self-pay

## 2023-11-30 NOTE — Telephone Encounter (Signed)
  Chief Complaint: abd cramping,  Symptoms: pain near naval area Frequency: about a week Pertinent Negatives: Patient denies fever, GU s/s, blood in stool,  Disposition: [] ED /[] Urgent Care (no appt availability in office) / [x] Appointment(In office/virtual)/ []  [facility]/ [] Home Care/ [] Refused Recommended Disposition /[] [facility]/ []  Follow-up with PCP Additional Notes: Pt states that for about a week he has been having abd cramping, center abd. Pt denies GU s/s, denies blood in stool, states that he had a BM this morning. Pt admits to using numerous OTC medications, including laxatives and pepto. Pt states that he has been eating sporadically. Pt states that he has had this in the past but states that it was a long time ago, per chart-GERD in 2023, pt states it was about 2 years ago. Pt states that he has not been N/V. Routing to clinic [facility] for scheduling.  Copied from CRM [id number]. Topic: Clinical - Red Word Triage >> Nov 30, 2023  9:01 AM Carlatta H wrote: [facility] that prompted transfer to Nurse Triage: Patient has been experiencing stomach pains for about 1 week//Pain is severe and he cannot sleep at night// Reason for Disposition  [1] MILD-MODERATE pain AND [2] constant AND [3] present > 2 hours  Answer Assessment - Initial Assessment Questions 1. LOCATION: "Where does it hurt?"      Center, above the naval 2. RADIATION: "Does the pain shoot anywhere else?" (e.g., chest, back)     denies 3. ONSET: "When did the pain begin?" (Minutes, hours or days ago)      About 1 week 4. SUDDEN: "Gradual or sudden onset?"     sudden 5. PATTERN "Does the pain come and go, or is it constant?"    - If it comes and goes: "How long does it last?" "Do you have pain now?"     (Note: Comes and goes means the pain is intermittent. It goes away completely between bouts.)    - If constant: "Is it getting better, staying the same, or getting worse?"      (Note: Constant means the pain  never goes away completely; most serious pain is constant and gets worse.)      Intermittent, sometimes constant 6. SEVERITY: "How bad is the pain?"  (e.g., Scale 1-10; mild, moderate, or severe)    - MILD (1-3): Doesn't interfere with normal activities, abdomen soft and not tender to touch.     - MODERATE (4-7): Interferes with normal activities or awakens from sleep, abdomen tender to touch.     - SEVERE (8-10): Excruciating pain, doubled over, unable to do any normal activities.       8 7. RECURRENT SYMPTOM: "Have you ever had this type of stomach pain before?" If Yes, ask: "When was the last time?" and "What happened that time?"      Has had before, not sure what it was  8. CAUSE: "What do you think is causing the stomach pain?"     Pt states that he does not remember what it has been in the past 9. RELIEVING/AGGRAVATING FACTORS: "What makes it better or worse?" (e.g., antacids, bending or twisting motion, bowel movement)     Unsure, pt is avoiding food intake at this time, d/t pain.  10. OTHER SYMPTOMS: "Do you have any other symptoms?" (e.g., back pain, diarrhea, fever, urination pain, vomiting)       denies  Protocols used: Abdominal Pain - Male-A-AH

## 2023-11-30 NOTE — Telephone Encounter (Signed)
 Report to Emergency Department/Urgent Care/call 911 for immediate medical evaluation. Follow-up with Primary Care.

## 2023-11-30 NOTE — Telephone Encounter (Signed)
 Patient informed no opening on schedule for today or tomorrow.   Advised of Mobile Unit Operations.  Given the location for tomorrow but advised for worsening sx's to go to [facility] or ED.  Patient verbalized understanding.

## 2023-12-01 ENCOUNTER — Ambulatory Visit: Admitting: Physician Assistant

## 2023-12-01 ENCOUNTER — Encounter: Payer: Self-pay | Admitting: Physician Assistant

## 2023-12-01 VITALS — BP 127/89 | HR 80 | Ht 67.0 in | Wt 164.0 lb

## 2023-12-01 DIAGNOSIS — E782 Mixed hyperlipidemia: Secondary | ICD-10-CM

## 2023-12-01 DIAGNOSIS — I10 Essential (primary) hypertension: Secondary | ICD-10-CM | POA: Diagnosis not present

## 2023-12-01 DIAGNOSIS — Z1211 Encounter for screening for malignant neoplasm of colon: Secondary | ICD-10-CM

## 2023-12-01 DIAGNOSIS — Z125 Encounter for screening for malignant neoplasm of prostate: Secondary | ICD-10-CM | POA: Diagnosis not present

## 2023-12-01 DIAGNOSIS — D751 Secondary polycythemia: Secondary | ICD-10-CM

## 2023-12-01 DIAGNOSIS — D171 Benign lipomatous neoplasm of skin and subcutaneous tissue of trunk: Secondary | ICD-10-CM

## 2023-12-01 DIAGNOSIS — R1013 Epigastric pain: Secondary | ICD-10-CM | POA: Diagnosis not present

## 2023-12-01 DIAGNOSIS — K219 Gastro-esophageal reflux disease without esophagitis: Secondary | ICD-10-CM

## 2023-12-01 MED ORDER — PANTOPRAZOLE SODIUM 40 MG PO TBEC: 40.0000 mg | DELAYED_RELEASE_TABLET | Freq: Every day | ORAL | 1 refills | Status: DC

## 2023-12-01 NOTE — Progress Notes (Signed)
 Established Patient Office Visit  Subjective   Patient ID: Bradley Thompson, male    DOB: March 16, 1946  Age: 78 y.o. MRN: [id number]  Chief Complaint  Patient presents with   Abdominal Pain    Denies any diarrhea, pain started a few days ago, and has been consistent.Patient has tried many OTC medications with no relief. Describes as ache feeling in the upper portion of his stomach    Discussed the use of AI scribe software for clinical note transcription with the patient, who gave verbal consent to proceed.  History of Present Illness   Bradley Thompson is a 78 year old male with a history of H. pylori infection who presents with recurrent abdominal pain and acid reflux symptoms.  He experiences intermittent abdominal pain above the navel since last Thursday, described as burning and affecting sleep. The pain is present throughout the day but is not tight or stabbing. Occasional acid reflux symptoms include a foul taste in his mouth, occurring last night and the day before. He uses over-the-counter medications such as Tums and Exlax and consumes applesauce and toast. He started taking Tums yesterday. There are no changes in bowel movements, and he is not using any new medications. He recalls using Protonix  in the past, which helped with similar symptoms. He eats before bed, sometimes consuming sweet foods, and is trying to modify this behavior by eating earlier and remaining upright after meals.  Also complains of lipomas on his back, states that they have been there for many years, but are starting to become bothersome.  States that it is affecting his lifestyle, states that he is embarrassed to remove his shirt when he goes to the beach.  States that he only had one previously but the second one has grown and now it appears the third line is coming out.  States that he stopped taking his blood pressure medication on his own, states that he did research on losartan  and decided he did not want to  take a chance of any long-term effects from the medication.  States that he has been exercising, watching what he is, and states that he does monitor his blood pressure at home several times a week and readings remain within normal limits.     Past Medical History:  Diagnosis Date   Helicobacter pylori (H. 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 . married '68 - 20 yrs/widowed; serial monogomy. 2 sons - '72, '82:  Oldest son taught in [place] for several years. He lives [place]; younger son lives in [place]; 1 grandson.Aaron Aas  retired '07 - worked [employer]. Remains sexually active - uses barrier protection.   Social Drivers of Corporate investment banker Strain: Low Risk  (10/22/2023)   Overall Financial Resource Strain (CARDIA)    Difficulty of Paying Living Expenses: Not hard at all  Food Insecurity: No Food Insecurity (10/22/2023)   Hunger Vital Sign    Worried About Running Out of Food in the Last Year: Never true    Ran Out of Food in the Last Year: Never true  Transportation Needs: No Transportation Needs (10/22/2023)   PRAPARE - Administrator, Civil Service (Medical): No    Lack of Transportation (Non-Medical):  No  Physical Activity: Insufficiently Active (10/22/2023)   Exercise Vital Sign    Days of Exercise per Week: 4 days    Minutes of Exercise per Session: 30 min  Stress: No Stress Concern Present (10/22/2023)   [employer] of Occupational Health - Occupational Stress Questionnaire    Feeling of Stress : Not at all  Social Connections: Moderately Isolated (10/22/2023)   Social Connection and Isolation Panel [NHANES]    Frequency of Communication with Friends and Family: More than three times a week    Frequency of Social Gatherings with Friends and Family: More than three times a week    Attends Religious Services: More than 4 times per year    Active Member of [employer] or Organizations: No    Attends Banker Meetings: Never    Marital Status: Widowed  Intimate Partner Violence: Not At Risk (10/22/2023)   Humiliation, Afraid, Rape, and Kick questionnaire    Fear of Current or Ex-Partner: No    Emotionally Abused: No    Physically Abused: No    Sexually Abused: No   Family History  Problem Relation Age of Onset   Colon cancer Neg Hx    Esophageal cancer Neg Hx    Liver cancer Neg Hx    Pancreatic cancer Neg Hx    Rectal cancer Neg Hx    Stomach cancer Neg Hx    No Known Allergies  Review of Systems  Constitutional:  Negative for chills and fever.  HENT: Negative.    Eyes: Negative.   Respiratory:  Negative for shortness of breath.   Cardiovascular:  Negative for chest pain.  Gastrointestinal:  Positive for abdominal pain and heartburn. Negative for constipation, diarrhea, nausea and vomiting.  Genitourinary: Negative.   Musculoskeletal: Negative.   Skin: Negative.   Neurological: Negative.   Endo/Heme/Allergies: Negative.   Psychiatric/Behavioral: Negative.        Objective:     BP 127/89 (BP Location: Left Arm, Patient Position: Sitting, Cuff Size: Normal)   Pulse 80   Ht 5\' 7"  (1.702 m)   Wt 164 lb (74.4 kg)   SpO2 96%   BMI 25.69 kg/m  BP  Readings from Last 3 Encounters:  12/01/23 127/89  02/13/23 112/71  08/15/22 133/75   Wt Readings from Last 3 Encounters:  12/01/23 164 lb (74.4 kg)  02/13/23 175 lb (79.4 kg)  08/15/22 173 lb (78.5 kg)    Physical Exam Vitals and nursing note reviewed.  Constitutional:      Appearance: Normal appearance.  HENT:     Head: Normocephalic and atraumatic.     Right Ear: External ear normal.     Left Ear: External ear normal.     Nose: Nose normal.     Mouth/Throat:     Mouth: Mucous membranes are moist.  Pharynx: Oropharynx is clear.  Eyes:     Extraocular Movements: Extraocular movements intact.     Conjunctiva/sclera: Conjunctivae normal.     Pupils: Pupils are equal, round, and reactive to light.  Cardiovascular:     Rate and Rhythm: Normal rate and regular rhythm.     Pulses: Normal pulses.     Heart sounds: Normal heart sounds.  Pulmonary:     Effort: Pulmonary effort is normal.     Breath sounds: Normal breath sounds.  Abdominal:     General: Abdomen is flat. Bowel sounds are normal.     Tenderness: There is generalized abdominal tenderness. There is no guarding. Negative signs include Murphy's sign.  Musculoskeletal:     Cervical back: Normal range of motion and neck supple.  Skin:    General: Skin is warm and dry.     Comments: 3 separate subcutaneous nodules, see photo  Neurological:     General: No focal deficit present.     Mental Status: He is alert and oriented to person, place, and time.  Psychiatric:        Mood and Affect: Mood normal.        Behavior: Behavior normal.        Thought Content: Thought content normal.        Judgment: Judgment normal.        Assessment & Plan:   Problem List Items Addressed This Visit       Other   Hyperlipidemia   Relevant Orders   Lipid panel   Lipoma   Relevant Orders   Ambulatory referral to General Surgery   Other Visit Diagnoses       Gastroesophageal reflux disease, unspecified whether  esophagitis present    -  Primary   Relevant Medications   pantoprazole  (PROTONIX ) 40 MG tablet   Other Relevant Orders   CBC with Differential/Platelet   Comp. Metabolic Panel (12)     Abdominal pain, epigastric       Relevant Medications   pantoprazole  (PROTONIX ) 40 MG tablet     Essential hypertension         Screening PSA (prostate specific antigen)       Relevant Orders   PSA     Screen for colon cancer       Relevant Orders   Ambulatory referral to Gastroenterology       1. Gastroesophageal reflux disease, unspecified whether esophagitis present (Primary) Trial Protonix .  Patient education given on supportive care, red flags given for prompt reevaluation - pantoprazole  (PROTONIX ) 40 MG tablet; Take 1 tablet (40 mg total) by mouth daily.  Dispense: 60 tablet; Refill: 1 - CBC with Differential/Platelet - Comp. Metabolic Panel (12)  2. Abdominal pain, epigastric  - pantoprazole  (PROTONIX ) 40 MG tablet; Take 1 tablet (40 mg total) by mouth daily.  Dispense: 60 tablet; Refill: 1  3. Lipoma of torso Referred to general surgery for further evaluation - Ambulatory referral to General Surgery  4. Essential hypertension Continue checking blood pressure at home, well-maintained without blood pressure medication at this time.  5. Mixed hyperlipidemia Fasting labs completed today - Lipid panel  6. Screening PSA (prostate specific antigen)  - PSA  7. Screen for colon cancer  - Ambulatory referral to Gastroenterology   I have reviewed the patient's medical history (PMH, PSH, Social History, Family History, Medications, and allergies) , and have been updated if relevant. I spent 30 minutes reviewing chart and  face to face time with patient.  Return if symptoms worsen or fail to improve.    Bradley Hermann Mayers, PA-C

## 2023-12-01 NOTE — Patient Instructions (Addendum)
 VISIT SUMMARY:  During your visit, we discussed your recurrent abdominal pain and acid reflux symptoms, slightly elevated blood pressure, and lipomas on your back. We reviewed your current symptoms and medications, and we have made some adjustments to your treatment plan to help manage your conditions.  YOUR PLAN:  -GASTROESOPHAGEAL REFLUX DISEASE (GERD): GERD is a condition where stomach acid frequently flows back into the tube connecting your mouth and stomach, causing irritation. We will start you on Protonix  for two weeks to reduce stomach acid. Additionally, avoid eating before bed and stay upright for at least two hours after meals. If symptoms persist, we will refer you to a gastroenterologist.   -ESSENTIAL HYPERTENSION: Essential hypertension is high blood pressure with no identifiable cause. Your blood pressure is slightly elevated but acceptable for your age. Continue monitoring your blood pressure regularly and maintain lifestyle modifications such as exercise and a healthy diet. We can discuss alternative medications if needed.  -LIPOMAS: Lipomas are non-cancerous growths of fatty tissue. You have multiple lipomas on your back that are bothersome. We will refer you to general surgery for further evaluation.   GERD in Adults: Diet Changes When you have gastroesophageal reflux disease (GERD), you may need to make changes to your diet. Choosing the right foods can help with your symptoms. Think about working with an expert in healthy eating called a dietitian. They can help you make healthy food choices. What are tips for following this plan? Reading food labels Look for foods that are low in saturated fat. Foods that may help with your symptoms include: Foods with less than 5% of daily value (DV) of fat. Foods with 0 grams of trans fat. Cooking [employer] in ways that don't use a lot of fat. These ways include: Baking. Steaming. Grilling. Broiling. To add flavor, try to use  herbs that are low in spice and acidity. Avoid frying your food. Meal planning  Eat small meals often rather than eating 3 large meals each day. Eat your meals slowly in a place where you feel relaxed. If told by your health care provider, avoid: Foods that cause symptoms. Keep a food diary to keep track of foods that cause symptoms. Alcohol. Drinking a lot of liquid with meals. General instructions For 2-3 hours after you eat, avoid: Bending over. Exercise. Lying down. Chew sugar-free gum after meals. What foods should I eat? Eat a healthy diet. Try to include: Foods with high amounts of fiber. These include: Fruits and vegetables. Whole grains and beans. Low-fat dairy products. Lean meats, fish, and poultry. Egg whites. Foods that cause symptoms in someone else may not cause symptoms for you. Work with your provider to find foods that are safe for you. The items listed above may not be all the foods and drinks you can have. Talk with a dietitian to learn more. The items listed above may not be a complete list of foods and beverages you can eat and drink. Contact a dietitian for more information. What foods should I avoid? Limiting some of these foods may help with your symptoms. Each person is different. Talk with a dietitian or your provider to help you find the exact foods to avoid. Some of the foods to avoid may include: Fruits Fruits with a lot of acid in them. These may include citrus fruits, such as oranges, grapefruit, pineapple, and lemons. Vegetables Deep-fried vegetables, such as [place] fries. Vegetables, sauces, or toppings made with added fat and vegetables with acid in them. These may  include tomatoes and tomato products, chili peppers, onions, garlic, and horseradish. Grains Pastries or quick breads with added fat. Meats and other proteins High-fat meats, such as fatty beef or pork, hot dogs, ribs, ham, sausage, salami, and bacon. Fried meat or protein, such as  fried fish and fried chicken. Egg yolks. Fats and oils Butter. Margarine. Shortening. Ghee. Drinks Coffee and other drinks with caffeine in them. Fizzy and sugary drinks, such as soda and energy drinks. Fruit juice made with acidic fruits, such as orange or grapefruit. Tomato juice. Sweets and desserts Chocolate and cocoa. Donuts. Seasonings and condiments Mint, such as peppermint and spearmint. Condiments, herbs, or seasonings that cause symptoms. These may include curry, hot sauce, or vinegar-based salad dressings. The items listed above may not be all the foods and drinks you should avoid. Talk with a dietitian to learn more. Questions to ask your health care provider Changes to your diet and everyday life are often the first steps taken to manage symptoms of GERD. If these changes don't help, talk with your provider about taking medicines. Where to find more information International Foundation for Gastrointestinal Disorders: aboutgerd.org This information is not intended to replace advice given to you by your health care provider. Make sure you discuss any questions you have with your health care provider. Document Revised: 05/26/2023 Document Reviewed: 12/10/2022 Elsevier Patient Education  2024 [employer].

## 2023-12-01 NOTE — Telephone Encounter (Signed)
 No available appts with provider until 06/23

## 2023-12-02 LAB — COMP. METABOLIC PANEL (12)
AST: 13 IU/L (ref 0–40)
Albumin: 4.7 g/dL (ref 3.8–4.8)
Alkaline Phosphatase: 80 IU/L (ref 44–121)
BUN/Creatinine Ratio: 11 (ref 10–24)
BUN: 9 mg/dL (ref 8–27)
Bilirubin Total: 0.6 mg/dL (ref 0.0–1.2)
Calcium: 9.9 mg/dL (ref 8.6–10.2)
Chloride: 92 mmol/L — ABNORMAL LOW (ref 96–106)
Creatinine, Ser: 0.84 mg/dL (ref 0.76–1.27)
Globulin, Total: 2.5 g/dL (ref 1.5–4.5)
Glucose: 86 mg/dL (ref 70–99)
Potassium: 3.7 mmol/L (ref 3.5–5.2)
Sodium: 137 mmol/L (ref 134–144)
Total Protein: 7.2 g/dL (ref 6.0–8.5)
eGFR: 90 mL/min/{1.73_m2} (ref >59)

## 2023-12-02 LAB — LIPID PANEL
Chol/HDL Ratio: 4.3 ratio (ref 0.0–5.0)
Cholesterol, Total: 207 mg/dL — ABNORMAL HIGH (ref 100–199)
HDL: 48 mg/dL (ref >39)
LDL Chol Calc (NIH): 144 mg/dL — ABNORMAL HIGH (ref 0–99)
Triglycerides: 85 mg/dL (ref 0–149)
VLDL Cholesterol Cal: 15 mg/dL (ref 5–40)

## 2023-12-02 LAB — CBC WITH DIFFERENTIAL/PLATELET
Basophils Absolute: 0.1 10*3/uL (ref 0.0–0.2)
Basos: 1 %
EOS (ABSOLUTE): 0 10*3/uL (ref 0.0–0.4)
Eos: 0 %
Hematocrit: 56.5 % — ABNORMAL HIGH (ref 37.5–51.0)
Hemoglobin: 18.5 g/dL — ABNORMAL HIGH (ref 13.0–17.7)
Immature Grans (Abs): 0 10*3/uL (ref 0.0–0.1)
Immature Granulocytes: 0 %
Lymphocytes Absolute: 1.2 10*3/uL (ref 0.7–3.1)
Lymphs: 13 %
MCH: 29 pg (ref 26.6–33.0)
MCHC: 32.7 g/dL (ref 31.5–35.7)
MCV: 89 fL (ref 79–97)
Monocytes Absolute: 0.6 10*3/uL (ref 0.1–0.9)
Monocytes: 7 %
Neutrophils Absolute: 7.3 10*3/uL — ABNORMAL HIGH (ref 1.4–7.0)
Neutrophils: 79 %
Platelets: 234 10*3/uL (ref 150–450)
RBC: 6.37 x10E6/uL — ABNORMAL HIGH (ref 4.14–5.80)
RDW: 15.2 % (ref 11.6–15.4)
WBC: 9.2 10*3/uL (ref 3.4–10.8)

## 2023-12-02 LAB — PSA: Prostate Specific Ag, Serum: 1.4 ng/mL (ref 0.0–4.0)

## 2023-12-02 NOTE — Telephone Encounter (Signed)
 Patient was seen with [facility] 12/01/2023

## 2023-12-03 ENCOUNTER — Encounter: Payer: Self-pay | Admitting: Physician Assistant

## 2023-12-03 MED ORDER — ATORVASTATIN CALCIUM 10 MG PO TABS: 10.0000 mg | ORAL_TABLET | Freq: Every day | ORAL | 2 refills | Status: DC

## 2023-12-03 NOTE — Addendum Note (Signed)
 Addended by: Malcom Scriver on: 12/03/2023 10:32 AM   Modules accepted: Orders

## 2023-12-09 ENCOUNTER — Other Ambulatory Visit: Payer: Self-pay | Admitting: Physician Assistant

## 2023-12-09 DIAGNOSIS — K219 Gastro-esophageal reflux disease without esophagitis: Secondary | ICD-10-CM

## 2023-12-09 DIAGNOSIS — R1013 Epigastric pain: Secondary | ICD-10-CM

## 2023-12-10 ENCOUNTER — Other Ambulatory Visit: Payer: Self-pay | Admitting: Physician Assistant

## 2023-12-10 DIAGNOSIS — E782 Mixed hyperlipidemia: Secondary | ICD-10-CM

## 2023-12-11 ENCOUNTER — Ambulatory Visit: Payer: Self-pay | Admitting: Surgery

## 2023-12-11 DIAGNOSIS — R229 Localized swelling, mass and lump, unspecified: Secondary | ICD-10-CM | POA: Diagnosis not present

## 2023-12-11 NOTE — H&P (Signed)
 Bradley Thompson [id number]   Referring Provider:  Abraham Abo, MD   Subjective   Chief Complaint: New Consultation (lipoma)     History of Present Illness:    78 year old male with history of H. pylori infection, hyperlipidemia, hypertension, GERD who presents for evaluation of possible lipomas on his back.  They have been there for many years but have become increasingly bothersome lately.  Initially had 1 but 2 others have surfaced over the years. They are tender whenever he leans against this area.  No previous procedures, and no previous infection or drainage that he is noted.   Review of Systems: A complete review of systems was obtained from the patient.  I have reviewed this information and discussed as appropriate with the patient.  See HPI as well for other ROS.   Medical History: Past Medical History:  Diagnosis Date   GERD (gastroesophageal reflux disease)    Hyperlipidemia    Hypertension     There is no problem list on file for this patient.   History reviewed. No pertinent surgical history.   No Known Allergies  Current Outpatient Medications on File Prior to Visit  Medication Sig Dispense Refill   pantoprazole  (PROTONIX ) 40 MG DR tablet Take 40 mg by mouth once daily     No current facility-administered medications on file prior to visit.    Family History  Problem Relation Age of Onset   High blood pressure (Hypertension) Mother    Hyperlipidemia (Elevated cholesterol) Father      Social History   Tobacco Use  Smoking Status Never  Smokeless Tobacco Never     Social History   Socioeconomic History   Marital status: Widowed  Tobacco Use   Smoking status: Never   Smokeless tobacco: Never  Vaping Use   Vaping status: Never Used  Substance and Sexual Activity   Alcohol use: Yes    Comment: once a week   Drug use: Never   Social Drivers of Corporate investment banker Strain: Low Risk  (10/22/2023)   Received from [facility]    Overall Financial Resource Strain (CARDIA)    Difficulty of Paying Living Expenses: Not hard at all  Food Insecurity: No Food Insecurity (10/22/2023)   Received from [facility]   Hunger Vital Sign    Worried About Running Out of Food in the Last Year: Never true    Ran Out of Food in the Last Year: Never true  Transportation Needs: No Transportation Needs (10/22/2023)   Received from [facility] - Transportation    Lack of Transportation (Medical): No    Lack of Transportation (Non-Medical): No  Physical Activity: Insufficiently Active (10/22/2023)   Received from [facility]   Exercise Vital Sign    Days of Exercise per Week: 4 days    Minutes of Exercise per Session: 30 min  Stress: No Stress Concern Present (10/22/2023)   Received from [facility] of Occupational Health - Occupational Stress Questionnaire    Feeling of Stress : Not at all  Social Connections: Moderately Isolated (10/22/2023)   Received from [facility]   Social Connection and Isolation Panel [NHANES]    Frequency of Communication with Friends and Family: More than three times a week    Frequency of Social Gatherings with Friends and Family: More than three times a week    Attends Religious Services: More than 4 times per year    Active Member of [employer] or Organizations:  No    Attends Banker Meetings: Never    Marital Status: Widowed  Housing Stability: Unknown (12/11/2023)   Housing Stability Vital Sign    Homeless in the Last Year: No    Objective:    Vitals:   12/11/23 1039  BP: (!) 142/86  Pulse: 75  Temp: 36.7 C (98 F)  SpO2: 95%  Weight: 76.3 kg (168 lb 3.2 oz)  Height: 170.2 cm (5\' 7" )  PainSc:   4    Body mass index is 26.34 kg/m.  Gen: A&Ox3, no distress  Chest: respiratory effort is normal. Neuro: no gross deficit Psych: appropriate mood and affect, normal insight/judgment intact  Skin: warm and dry.  On the mid back to the right of midline  there are 3 subcutaneous lesions most consistent with sebaceous cysts.  The most cephalad is the largest measuring approximately 5 x 3 cm, inferior to this is another measuring about 3 cm in diameter, and then lateral to these 2 is another measuring about 1.5 cm in diameter.  Each is a smooth, mobile subcutaneous mass with a central overlying pore.   Assessment and Plan:  Diagnoses and all orders for this visit:  Subcutaneous mass  Desires excision.  We discussed the procedure and I reviewed risks including bleeding, infection, pain, scarring, seroma/hematoma, wound healing problems or undesired cosmetic result, recurrence of the lesions.  Questions welcomed and answered to his satisfaction.  He wishes to proceed.  Aldon Hung MD FACS

## 2023-12-11 NOTE — H&P (View-Only) (Signed)
 Bradley Thompson [id number]   Referring Provider:  Abraham Abo, MD   Subjective   Chief Complaint: New Consultation (lipoma)     History of Present Illness:    78 year old male with history of H. pylori infection, hyperlipidemia, hypertension, GERD who presents for evaluation of possible lipomas on his back.  They have been there for many years but have become increasingly bothersome lately.  Initially had 1 but 2 others have surfaced over the years. They are tender whenever he leans against this area.  No previous procedures, and no previous infection or drainage that he is noted.   Review of Systems: A complete review of systems was obtained from the patient.  I have reviewed this information and discussed as appropriate with the patient.  See HPI as well for other ROS.   Medical History: Past Medical History:  Diagnosis Date   GERD (gastroesophageal reflux disease)    Hyperlipidemia    Hypertension     There is no problem list on file for this patient.   History reviewed. No pertinent surgical history.   No Known Allergies  Current Outpatient Medications on File Prior to Visit  Medication Sig Dispense Refill   pantoprazole  (PROTONIX ) 40 MG DR tablet Take 40 mg by mouth once daily     No current facility-administered medications on file prior to visit.    Family History  Problem Relation Age of Onset   High blood pressure (Hypertension) Mother    Hyperlipidemia (Elevated cholesterol) Father      Social History   Tobacco Use  Smoking Status Never  Smokeless Tobacco Never     Social History   Socioeconomic History   Marital status: Widowed  Tobacco Use   Smoking status: Never   Smokeless tobacco: Never  Vaping Use   Vaping status: Never Used  Substance and Sexual Activity   Alcohol use: Yes    Comment: once a week   Drug use: Never   Social Drivers of Corporate investment banker Strain: Low Risk  (10/22/2023)   Received from [facility]    Overall Financial Resource Strain (CARDIA)    Difficulty of Paying Living Expenses: Not hard at all  Food Insecurity: No Food Insecurity (10/22/2023)   Received from [facility]   Hunger Vital Sign    Worried About Running Out of Food in the Last Year: Never true    Ran Out of Food in the Last Year: Never true  Transportation Needs: No Transportation Needs (10/22/2023)   Received from [facility] - Transportation    Lack of Transportation (Medical): No    Lack of Transportation (Non-Medical): No  Physical Activity: Insufficiently Active (10/22/2023)   Received from [facility]   Exercise Vital Sign    Days of Exercise per Week: 4 days    Minutes of Exercise per Session: 30 min  Stress: No Stress Concern Present (10/22/2023)   Received from [facility] - Occupational Stress Questionnaire    Feeling of Stress : Not at all  Social Connections: Moderately Isolated (10/22/2023)   Received from [facility]   Social Connection and Isolation Panel [NHANES]    Frequency of Communication with Friends and Family: More than three times a week    Frequency of Social Gatherings with Friends and Family: More than three times a week    Attends Religious Services: More than 4 times per year    Active Member of [employer] or Organizations:  No    Attends Banker Meetings: Never    Marital Status: Widowed  Housing Stability: Unknown (12/11/2023)   Housing Stability Vital Sign    Homeless in the Last Year: No    Objective:    Vitals:   12/11/23 1039  BP: (!) 142/86  Pulse: 75  Temp: 36.7 C (98 F)  SpO2: 95%  Weight: 76.3 kg (168 lb 3.2 oz)  Height: 170.2 cm (5\' 7" )  PainSc:   4    Body mass index is 26.34 kg/m.  Gen: A&Ox3, no distress  Chest: respiratory effort is normal. Neuro: no gross deficit Psych: appropriate mood and affect, normal insight/judgment intact  Skin: warm and dry.  On the mid back to the right of midline  there are 3 subcutaneous lesions most consistent with sebaceous cysts.  The most cephalad is the largest measuring approximately 5 x 3 cm, inferior to this is another measuring about 3 cm in diameter, and then lateral to these 2 is another measuring about 1.5 cm in diameter.  Each is a smooth, mobile subcutaneous mass with a central overlying pore.   Assessment and Plan:  Diagnoses and all orders for this visit:  Subcutaneous mass  Desires excision.  We discussed the procedure and I reviewed risks including bleeding, infection, pain, scarring, seroma/hematoma, wound healing problems or undesired cosmetic result, recurrence of the lesions.  Questions welcomed and answered to his satisfaction.  He wishes to proceed.  Aldon Hung MD FACS

## 2023-12-16 ENCOUNTER — Encounter (HOSPITAL_COMMUNITY): Payer: Self-pay

## 2023-12-16 NOTE — Progress Notes (Addendum)
 PCP - Abraham Abo, MD LOV 02-13-23 epic Cardiologist - no  PPM/ICD -  Device Orders -  Rep Notified -   Chest x-ray -  EKG - 12-17-23 Stress Test -  ECHO -  Cardiac Cath -   Sleep Study - n/a CPAP -   Fasting Blood Sugar -  Checks Blood Sugar _N/A____ times a day  Blood Thinner Instructions: Aspirin Instructions:  ERAS Protcol - PRE-SURGERY N/A   COVID vaccine -yes  Activity--Able to climb a flight of stairs with no CP or SOB has stairs in home Anesthesia review: HTN no meds  Patient denies shortness of breath, fever, cough and chest pain at PAT appointment   All instructions explained to the patient, with a verbal understanding of the material. Patient agrees to go over the instructions while at home for a better understanding. Patient also instructed to self quarantine after being tested for COVID-19. The opportunity to ask questions was provided.

## 2023-12-16 NOTE — Patient Instructions (Signed)
 SURGICAL WAITING ROOM VISITATION  Patients having surgery or a procedure may have no more than 2 support people in the waiting area - these visitors may rotate.    Children under the age of 31 must have an adult with them who is not the patient.   Visitors with respiratory illnesses are discouraged from visiting and should remain at home.  If the patient needs to stay at the hospital during part of their recovery, the visitor guidelines for inpatient rooms apply. Pre-op nurse will coordinate an appropriate time for 1 support person to accompany patient in pre-op.  This support person may not rotate.    Please refer to the [facility] website for the visitor guidelines for Inpatients (after your surgery is over and you are in a regular room).       Your procedure is scheduled on: 12-22-23   Report to [facility] Main Entrance    Report to admitting at      0915  AM   Call this number if you have problems the morning of surgery [phone]   Do not eat food :After Midnight.               If you have questions, please contact your surgeon's office.   FOLLOW ANY ADDITIONAL PRE OP INSTRUCTIONS YOU RECEIVED FROM YOUR SURGEON'S OFFICE!!!     Oral Hygiene is also important to reduce your risk of infection.                                    Remember - BRUSH YOUR TEETH THE MORNING OF SURGERY WITH YOUR REGULAR TOOTHPASTE  DENTURES WILL BE REMOVED PRIOR TO SURGERY PLEASE DO NOT APPLY "Poly grip" OR ADHESIVES!!!   Do NOT smoke after Midnight   Stop all vitamins and herbal supplements 7 days before surgery.   Take these medicines the morning of surgery with A SIP OF WATER: Pantoprazole , atorvastatin   DO NOT TAKE ANY ORAL DIABETIC MEDICATIONS DAY OF YOUR SURGERY  Bring CPAP mask and tubing day of surgery.                              You may not have any metal on your body including hair pins, jewelry, and body piercing             Do not wear , lotions, powders,  /cologne, or deodorant               Men may shave face and neck.   Do not bring valuables to the hospital. [place] IS NOT             RESPONSIBLE   FOR VALUABLES.   Contacts, glasses, dentures or bridgework may not be worn into surgery.   Bring small overnight bag day of surgery.   DO NOT BRING YOUR HOME MEDICATIONS TO THE HOSPITAL. PHARMACY WILL DISPENSE MEDICATIONS LISTED ON YOUR MEDICATION LIST TO YOU DURING YOUR ADMISSION IN THE HOSPITAL!    Patients discharged on the day of surgery will not be allowed to drive home.  Someone NEEDS to stay with you for the first 24 hours after anesthesia.   Special Instructions: Bring a copy of your healthcare power of attorney and living will documents the day of surgery if you haven't scanned them before.  Please read over the following fact sheets you were given: IF YOU HAVE QUESTIONS ABOUT YOUR PRE-OP INSTRUCTIONS PLEASE CALL [phone]   I. If you test positive for Covid or have been in contact with anyone that has tested positive in the last 10 days please notify you surgeon.    [place] - Preparing for Surgery Before surgery, you can play an important role.  Because skin is not sterile, your skin needs to be as free of germs as possible.  You can reduce the number of germs on your skin by washing with CHG (chlorahexidine gluconate) soap before surgery.  CHG is an antiseptic cleaner which kills germs and bonds with the skin to continue killing germs even after washing. Please DO NOT use if you have an allergy to CHG or antibacterial soaps.  If your skin becomes reddened/irritated stop using the CHG and inform your nurse when you arrive at Short Stay. Do not shave (including legs and underarms) for at least 48 hours prior to the first CHG shower.  You may shave your face/neck. Please follow these instructions carefully:  1.  Shower with CHG Soap the night before surgery and the  morning of Surgery.  2.  If you choose to  wash your hair, wash your hair first as usual with your  normal  shampoo.  3.  After you shampoo, rinse your hair and body thoroughly to remove the  shampoo.                           4.  Use CHG as you would any other liquid soap.  You can apply chg directly  to the skin and wash                       Gently with a scrungie or clean washcloth.  5.  Apply the CHG Soap to your body ONLY FROM THE NECK DOWN.   Do not use on face/ open                           Wound or open sores. Avoid contact with eyes, ears mouth and genitals (private parts).                       Wash face,  Genitals (private parts) with your normal soap.             6.  Wash thoroughly, paying special attention to the area where your surgery  will be performed.  7.  Thoroughly rinse your body with warm water from the neck down.  8.  DO NOT shower/wash with your normal soap after using and rinsing off  the CHG Soap.                9.  Pat yourself dry with a clean towel.            10.  Wear clean pajamas.            11.  Place clean sheets on your bed the night of your first shower and do not  sleep with pets. Day of Surgery : Do not apply any lotions/deodorants the morning of surgery.  Please wear clean clothes to the hospital/surgery center.  FAILURE TO FOLLOW THESE INSTRUCTIONS MAY RESULT IN THE CANCELLATION OF YOUR SURGERY PATIENT SIGNATURE_________________________________  NURSE SIGNATURE__________________________________  ________________________________________________________________________

## 2023-12-17 ENCOUNTER — Other Ambulatory Visit: Payer: Self-pay

## 2023-12-17 ENCOUNTER — Encounter (HOSPITAL_COMMUNITY)
Admission: RE | Admit: 2023-12-17 | Discharge: 2023-12-17 | Disposition: A | Discharge status: Home / Self Care | Source: Ambulatory Visit | Attending: Surgery | Admitting: Surgery

## 2023-12-17 ENCOUNTER — Encounter (HOSPITAL_COMMUNITY): Payer: Self-pay

## 2023-12-17 VITALS — BP 119/74 | HR 55 | Temp 98.3°F | Resp 18 | Ht 67.0 in | Wt 168.0 lb

## 2023-12-17 DIAGNOSIS — Z01810 Encounter for preprocedural cardiovascular examination: Secondary | ICD-10-CM | POA: Diagnosis not present

## 2023-12-17 DIAGNOSIS — I10 Essential (primary) hypertension: Secondary | ICD-10-CM | POA: Diagnosis not present

## 2023-12-17 DIAGNOSIS — Z01818 Encounter for other preprocedural examination: Secondary | ICD-10-CM

## 2023-12-17 HISTORY — DX: Gastro-esophageal reflux disease without esophagitis: K21.9

## 2023-12-17 HISTORY — DX: Essential (primary) hypertension: I10

## 2023-12-22 ENCOUNTER — Ambulatory Visit (HOSPITAL_BASED_OUTPATIENT_CLINIC_OR_DEPARTMENT_OTHER): Admitting: Anesthesiology

## 2023-12-22 ENCOUNTER — Other Ambulatory Visit: Payer: Self-pay

## 2023-12-22 ENCOUNTER — Ambulatory Visit (HOSPITAL_COMMUNITY)
Admission: RE | Admit: 2023-12-22 | Discharge: 2023-12-22 | Disposition: A | Discharge status: Home / Self Care | Attending: Surgery | Admitting: Surgery

## 2023-12-22 ENCOUNTER — Encounter (HOSPITAL_COMMUNITY)
Admission: RE | Disposition: A | Discharge status: Home / Self Care | Payer: Self-pay | Source: Home / Self Care | Attending: Surgery

## 2023-12-22 ENCOUNTER — Encounter (HOSPITAL_COMMUNITY): Payer: Self-pay | Admitting: Surgery

## 2023-12-22 ENCOUNTER — Ambulatory Visit (HOSPITAL_COMMUNITY): Admitting: Anesthesiology

## 2023-12-22 DIAGNOSIS — K219 Gastro-esophageal reflux disease without esophagitis: Secondary | ICD-10-CM | POA: Insufficient documentation

## 2023-12-22 DIAGNOSIS — E785 Hyperlipidemia, unspecified: Secondary | ICD-10-CM | POA: Insufficient documentation

## 2023-12-22 DIAGNOSIS — L720 Epidermal cyst: Secondary | ICD-10-CM | POA: Insufficient documentation

## 2023-12-22 DIAGNOSIS — R229 Localized swelling, mass and lump, unspecified: Secondary | ICD-10-CM | POA: Diagnosis present

## 2023-12-22 DIAGNOSIS — I10 Essential (primary) hypertension: Secondary | ICD-10-CM | POA: Diagnosis not present

## 2023-12-22 DIAGNOSIS — L729 Follicular cyst of the skin and subcutaneous tissue, unspecified: Secondary | ICD-10-CM | POA: Diagnosis not present

## 2023-12-22 DIAGNOSIS — Z01818 Encounter for other preprocedural examination: Secondary | ICD-10-CM

## 2023-12-22 DIAGNOSIS — Z79899 Other long term (current) drug therapy: Secondary | ICD-10-CM | POA: Diagnosis not present

## 2023-12-22 HISTORY — PX: EXCISION MASS, BACK: SHX7560

## 2023-12-22 LAB — GLUCOSE, CAPILLARY: Glucose-Capillary: 113 mg/dL — ABNORMAL HIGH (ref 70–99)

## 2023-12-22 SURGERY — EXCISION MASS, BACK
Anesthesia: Monitor Anesthesia Care

## 2023-12-22 MED ORDER — FENTANYL CITRATE (PF) 100 MCG/2ML IJ SOLN
INTRAMUSCULAR | Status: DC | PRN
  Administered 2023-12-22 (×4): 25 ug via INTRAVENOUS

## 2023-12-22 MED ORDER — GLYCOPYRROLATE 0.2 MG/ML IJ SOLN
INTRAMUSCULAR | Status: DC | PRN
  Administered 2023-12-22: .1 mg via INTRAVENOUS

## 2023-12-22 MED ORDER — PROPOFOL 10 MG/ML IV BOLUS
INTRAVENOUS | Status: AC
  Filled 2023-12-22: qty 20

## 2023-12-22 MED ORDER — ORAL CARE MOUTH RINSE: 15.0000 mL | Freq: Once | OROMUCOSAL | Status: AC

## 2023-12-22 MED ORDER — ACETAMINOPHEN 10 MG/ML IV SOLN: 1000.0000 mg | Freq: Once | INTRAVENOUS | Status: DC | PRN

## 2023-12-22 MED ORDER — ONDANSETRON HCL 4 MG/2ML IJ SOLN
INTRAMUSCULAR | Status: DC | PRN
  Administered 2023-12-22: 4 mg via INTRAVENOUS

## 2023-12-22 MED ORDER — HYDROMORPHONE HCL 1 MG/ML IJ SOLN: 0.2500 mg | INTRAMUSCULAR | Status: DC | PRN

## 2023-12-22 MED ORDER — CEFAZOLIN SODIUM-DEXTROSE 2-4 GM/100ML-% IV SOLN
2.0000 g | INTRAVENOUS | Status: AC
  Administered 2023-12-22: 2 g via INTRAVENOUS
  Filled 2023-12-22: qty 100

## 2023-12-22 MED ORDER — CHLORHEXIDINE GLUCONATE 4 % EX SOLN: 60.0000 mL | Freq: Once | CUTANEOUS | Status: DC

## 2023-12-22 MED ORDER — GLYCOPYRROLATE 0.2 MG/ML IJ SOLN
INTRAMUSCULAR | Status: AC
  Filled 2023-12-22: qty 1

## 2023-12-22 MED ORDER — TRAMADOL HCL 50 MG PO TABS: 50.0000 mg | ORAL_TABLET | Freq: Four times a day (QID) | ORAL | 0 refills | Status: AC | PRN

## 2023-12-22 MED ORDER — 0.9 % SODIUM CHLORIDE (POUR BTL) OPTIME
TOPICAL | Status: DC | PRN
  Administered 2023-12-22: 1000 mL

## 2023-12-22 MED ORDER — BUPIVACAINE-EPINEPHRINE (PF) 0.25% -1:200000 IJ SOLN
INTRAMUSCULAR | Status: AC
  Filled 2023-12-22: qty 30

## 2023-12-22 MED ORDER — GABAPENTIN 300 MG PO CAPS
300.0000 mg | ORAL_CAPSULE | ORAL | Status: AC
  Administered 2023-12-22: 300 mg via ORAL
  Filled 2023-12-22: qty 1

## 2023-12-22 MED ORDER — BUPIVACAINE-EPINEPHRINE 0.25% -1:200000 IJ SOLN
INTRAMUSCULAR | Status: DC | PRN
  Administered 2023-12-22: 9 mL

## 2023-12-22 MED ORDER — PROPOFOL 500 MG/50ML IV EMUL
INTRAVENOUS | Status: DC | PRN
  Administered 2023-12-22: 10 mg via INTRAVENOUS
  Administered 2023-12-22: 50 ug/kg/min via INTRAVENOUS
  Administered 2023-12-22: 20 mg via INTRAVENOUS
  Administered 2023-12-22: 30 mg via INTRAVENOUS
  Administered 2023-12-22: 20 mg via INTRAVENOUS

## 2023-12-22 MED ORDER — CHLORHEXIDINE GLUCONATE 0.12 % MT SOLN
15.0000 mL | Freq: Once | OROMUCOSAL | Status: AC
  Administered 2023-12-22: 15 mL via OROMUCOSAL

## 2023-12-22 MED ORDER — MIDAZOLAM HCL 2 MG/2ML IJ SOLN
INTRAMUSCULAR | Status: AC
  Filled 2023-12-22: qty 2

## 2023-12-22 MED ORDER — LACTATED RINGERS IV SOLN: INTRAVENOUS | Status: DC

## 2023-12-22 MED ORDER — ACETAMINOPHEN 500 MG PO TABS
1000.0000 mg | ORAL_TABLET | ORAL | Status: AC
  Administered 2023-12-22: 1000 mg via ORAL
  Filled 2023-12-22: qty 2

## 2023-12-22 MED ORDER — STERILE WATER FOR IRRIGATION IR SOLN
Status: DC | PRN
  Administered 2023-12-22: 1000 mL

## 2023-12-22 MED ORDER — FENTANYL CITRATE (PF) 100 MCG/2ML IJ SOLN
INTRAMUSCULAR | Status: AC
  Filled 2023-12-22: qty 2

## 2023-12-22 MED ORDER — MIDAZOLAM HCL 2 MG/2ML IJ SOLN
INTRAMUSCULAR | Status: DC | PRN
  Administered 2023-12-22: 1 mg via INTRAVENOUS

## 2023-12-22 SURGICAL SUPPLY — 26 items
BAG COUNTER SPONGE SURGICOUNT (BAG) IMPLANT
BENZOIN TINCTURE PRP APPL 2/3 (GAUZE/BANDAGES/DRESSINGS) IMPLANT
COVER SURGICAL LIGHT HANDLE (MISCELLANEOUS) ×1 IMPLANT
DERMABOND ADVANCED .7 DNX12 (GAUZE/BANDAGES/DRESSINGS) IMPLANT
DERMABOND ADVANCED .7 DNX6 (GAUZE/BANDAGES/DRESSINGS) IMPLANT
DRAPE LAPAROSCOPIC ABDOMINAL (DRAPES) IMPLANT
DRAPE LAPAROTOMY TRNSV 102X78 (DRAPES) IMPLANT
DRAPE UTILITY XL STRL (DRAPES) ×1 IMPLANT
ELECT REM PT RETURN 15FT ADLT (MISCELLANEOUS) ×1 IMPLANT
GAUZE SPONGE 4X4 12PLY STRL (GAUZE/BANDAGES/DRESSINGS) ×1 IMPLANT
GLOVE BIO SURGEON STRL SZ 6 (GLOVE) ×1 IMPLANT
GLOVE INDICATOR 6.5 STRL GRN (GLOVE) ×1 IMPLANT
GOWN STRL REUS W/ TWL LRG LVL3 (GOWN DISPOSABLE) ×1 IMPLANT
KIT BASIN OR (CUSTOM PROCEDURE TRAY) ×1 IMPLANT
KIT TURNOVER KIT A (KITS) IMPLANT
MARKER SKIN DUAL TIP RULER LAB (MISCELLANEOUS) IMPLANT
NDL HYPO 22X1.5 SAFETY MO (MISCELLANEOUS) ×1 IMPLANT
NEEDLE HYPO 22X1.5 SAFETY MO (MISCELLANEOUS) ×1 IMPLANT
PACK GENERAL/GYN (CUSTOM PROCEDURE TRAY) ×1 IMPLANT
SPIKE FLUID TRANSFER (MISCELLANEOUS) IMPLANT
STRIP CLOSURE SKIN 1/2X4 (GAUZE/BANDAGES/DRESSINGS) IMPLANT
SUT MNCRL AB 4-0 PS2 18 (SUTURE) ×1 IMPLANT
SUT VIC AB 3-0 SH 27XBRD (SUTURE) ×1 IMPLANT
SYR CONTROL 10ML LL (SYRINGE) ×1 IMPLANT
TAPE CLOTH SURG 4X10 WHT LF (GAUZE/BANDAGES/DRESSINGS) IMPLANT
TOWEL OR 17X26 10 PK STRL BLUE (TOWEL DISPOSABLE) ×1 IMPLANT

## 2023-12-22 NOTE — Transfer of Care (Signed)
 Immediate Anesthesia Transfer of Care Note  Patient: Bradley Thompson  Procedure(s) Performed: EXCISION MASS, BACK  Patient Location: PACU  Anesthesia Type:General  Level of Consciousness: awake, alert , and oriented  Airway & Oxygen Therapy: Patient Spontanous Breathing  Post-op Assessment: Report given to RN  Post vital signs: Reviewed and stable  Last Vitals:  Vitals Value Taken Time  BP 142/70 12/22/23 1232  Temp    Pulse    Resp    SpO2    Vitals shown include unfiled device data.  Last Pain:  Vitals:   12/22/23 1030  TempSrc:   PainSc: 0-No pain      Patients Stated Pain Goal: 4 (12/22/23 1030)  Complications: No notable events documented.

## 2023-12-22 NOTE — Anesthesia Preprocedure Evaluation (Signed)
 Anesthesia Evaluation  Patient identified by MRN, date of birth, ID band Patient awake    Reviewed: Allergy & Precautions, NPO status , Patient's Chart, lab work & pertinent test results  Airway Mallampati: II  TM Distance: >3 FB Neck ROM: Full    Dental no notable dental hx. (+) Partial Upper, Partial Lower   Pulmonary neg pulmonary ROS   Pulmonary exam normal        Cardiovascular hypertension,  Rhythm:Regular Rate:Normal     Neuro/Psych negative neurological ROS  negative psych ROS   GI/Hepatic Neg liver ROS,GERD  Medicated,,  Endo/Other  negative endocrine ROS    Renal/GU negative Renal ROS  negative genitourinary   Musculoskeletal Sebaceous cyst    Abdominal Normal abdominal exam  (+)   Peds  Hematology Lab Results      Component                Value               Date                      WBC                      9.2                 12/01/2023                HGB                      18.5 (H)            12/01/2023                HCT                      56.5 (H)            12/01/2023                MCV                      89                  12/01/2023                PLT                      234                 12/01/2023             Lab Results      Component                Value               Date                      NA                       137                 12/01/2023                K  3.7                 12/01/2023                CO2                      25                  08/15/2022                GLUCOSE                  86                  12/01/2023                BUN                      9                   12/01/2023                CREATININE               0.84                12/01/2023                CALCIUM                   9.9                 12/01/2023                GFR                      99.25               06/09/2014                EGFR                     90                   12/01/2023                GFRNONAA                 97.48               12/21/2008              Anesthesia Other Findings   Reproductive/Obstetrics                             Anesthesia Physical Anesthesia Plan  ASA: 2  Anesthesia Plan: MAC   Post-op Pain Management:    Induction: Intravenous  PONV Risk Score and Plan: 1 and Ondansetron, Dexamethasone, Propofol infusion and Treatment may vary due to age or medical condition  Airway Management Planned: Simple Face Mask and Nasal Cannula  Additional Equipment: None  Intra-op Plan:   Post-operative Plan:   Informed Consent: I have reviewed the patients History and Physical, chart, labs and discussed the procedure including the risks, benefits and alternatives for the proposed anesthesia with the patient or authorized representative who has indicated his/her understanding and acceptance.     Dental advisory given  Plan Discussed  with: CRNA  Anesthesia Plan Comments:        Anesthesia Quick Evaluation

## 2023-12-22 NOTE — Op Note (Signed)
 Operative Note  Bradley Thompson  [id number]  [id number]  12/22/2023   Surgeon: Aldon Hung MD FACS  Procedure performed: Excision of subcutaneous cyst from right upper back (three lesions)-5 x 4 x 3 cm, 3 x 3 x 3 cm, and 2 x 1 x 1 cm   Preop diagnosis: Subcutaneous cysts Post-op diagnosis/intraop findings: Same   Specimens: Subcutaneous cysts Retained items: no  EBL: minimal cc Complications: none   Description of procedure: After obtaining informed consent the patient was taken to the operating room and placed prone on the operating room table where MAC was initiated, preoperative antibiotics were administered, SCDs applied, and a formal timeout was performed.  The back was prepped and draped in usual sterile fashion.  After infiltration with local, an elliptical incision was made following the Langer's lines over each of the 3 subcutaneous cysts.  Cautery was used to dissect the dermis and soft tissue.  Beginning with the largest/ most superior cyst, combination of cautery and blunt dissection was then used to freed of its attachments to the surrounding soft tissue.  The cyst was excised intact.  Hemostasis was ensured and the wound.  In an identical fashion, the second largest cyst inferior to this was excised intact.  Finally in the more lateral and smallest cyst was excised in a similar fashion.  All 3 wounds were inspected and hemostasis confirmed.  The incisions were each closed with simple interrupted deep dermal 3-0 Vicryl followed by running subcuticular 4-0 Monocryl.  Benzoin, Steri-Strips and a light pressure dressing of gauze and tape were then applied.  The patient was then awakened, returned to the supine position and taken to PACU in stable condition.    All counts were correct at the completion of the case.

## 2023-12-22 NOTE — Anesthesia Postprocedure Evaluation (Signed)
 Anesthesia Post Note  Patient: Bradley Thompson  Procedure(s) Performed: EXCISION MASS, BACK     Patient location during evaluation: PACU Anesthesia Type: MAC Level of consciousness: awake and alert Pain management: pain level controlled Vital Signs Assessment: post-procedure vital signs reviewed and stable Respiratory status: spontaneous breathing, nonlabored ventilation, respiratory function stable and patient connected to nasal cannula oxygen Cardiovascular status: stable and blood pressure returned to baseline Postop Assessment: no apparent nausea or vomiting Anesthetic complications: no   No notable events documented.  Last Vitals:  Vitals:   12/22/23 1245 12/22/23 1300  BP: 135/80 (!) 144/80  Pulse: (!) 48 (!) 49  Resp: 12 17  Temp:    SpO2: 100% 95%    Last Pain:  Vitals:   12/22/23 1300  TempSrc:   PainSc: 0-No pain                 Theotis Flake P Marthella Osorno

## 2023-12-22 NOTE — Interval H&P Note (Signed)
 History and Physical Interval Note:  12/22/2023 9:58 AM  Bradley Thompson  has presented today for surgery, with the diagnosis of SUBCUTANEOUS MASS.  The various methods of treatment have been discussed with the patient and family. After consideration of risks, benefits and other options for treatment, the patient has consented to  Procedure(s) with comments: EXCISION MASS, BACK (N/A) - PRONE as a surgical intervention.  The patient's history has been reviewed, patient examined, no change in status, stable for surgery.  I have reviewed the patient's chart and labs.  Questions were answered to the patient's satisfaction.     Shani Fitch Loyola Rummage

## 2023-12-22 NOTE — Discharge Instructions (Signed)
 GENERAL SURGERY: POST OP INSTRUCTIONS  DIET: Follow a light bland diet & liquids the first 24 hours after arrival home, such as soup, liquids, starches, etc.  Be sure to drink plenty of fluids.  Quickly advance to a usual solid diet within a few days.  Avoid fast food or heavy meals as your are more likely to get nauseated or have irregular bowels. Take your usually prescribed home medications unless otherwise directed. PAIN CONTROL: Pain is best controlled by a usual combination of three different methods TOGETHER: Ice/Heat Over the counter pain medication Prescription pain medication Most patients will experience some swelling and bruising around the incisions.  Ice packs or heating pads (30-60 minutes up to 6 times a day) will help. Use ice for the first few days to help decrease swelling and bruising, then switch to heat to help relax tight/sore spots and speed recovery.  Some people prefer to use ice alone, heat alone, alternating between ice & heat.  Experiment to what works for you.  Swelling and bruising can take several weeks to resolve.   It is helpful to take an over-the-counter pain medication regularly for the first few weeks.  Choose one of the following that works best for you: Naproxen (Aleve, etc)  Two 220mg  tabs twice a day OR Ibuprofen (Advil, etc) Three 200mg  tabs four times a day (every meal & bedtime) AND Acetaminophen (Tylenol, etc) 500-650mg  four times a day (every meal & bedtime) A  prescription for pain medication (such as oxycodone, hydrocodone, etc) should be given to you upon discharge.  Take your pain medication as prescribed, IF NEEDED.  If you are having problems/concerns with the prescription medicine (does not control pain, nausea, vomiting, rash, itching, etc), please call us  [phone] to see if we need to switch you to a different pain medicine that will work better for you and/or control your side effect better. If you need a refill on your pain medication,  please contact your pharmacy.  They will contact our office to request authorization. Prescriptions will not be filled after 5 pm or on week-ends. Avoid getting constipated.  Between the surgery and the pain medications, it is common to experience some constipation.  Increasing fluid intake and taking a fiber supplement (such as Metamucil, Citrucel, FiberCon, MiraLax, etc) 1-2 times a day regularly will usually help prevent this problem from occurring.  A mild laxative (prune juice, Milk of Magnesia, MiraLax, etc) should be taken according to package directions if there are no bowel movements after 48 hours.   Wash / shower every day, starting 2 days after surgery.  You may shower over the steri strips as they are waterproof.  Continue to shower over incision(s) after the dressing is off. No rubbing, scrubbing, lotions or ointments to incisions. Do not soak or submerge incisions.  Remove your outer bandage 2 days after surgery; steri strips will peel off after 1-2 weeks.  You may leave the incision open to air.  You may have skin tapes (Steri Strips) covering the incision(s).  Leave them on until one week, then remove.  You may replace a dressing/Band-Aid to cover the incision for comfort if you wish.   ACTIVITIES as tolerated:   You may resume regular (light) daily activities beginning the next day--such as daily self-care, walking, climbing stairs--gradually increasing activities as tolerated.  If you can walk 30 minutes without difficulty, it is safe to try more intense activity such as jogging, treadmill, bicycling, low-impact aerobics, swimming, etc. Save the most  intensive and strenuous activity for last such as sit-ups, heavy lifting, contact sports, etc  Refrain from any heavy lifting or straining until you are off narcotics for pain control.   DO NOT PUSH THROUGH PAIN.  Let pain be your guide: If it hurts to do something, don't do it.  Pain is your body warning you to avoid that activity for another  week until the pain goes down. You may drive when you are no longer taking prescription pain medication, you can comfortably wear a seatbelt, and you can safely maneuver your car and apply brakes. You may have sexual intercourse when it is comfortable.  FOLLOW UP in our office Please call [facility] at [phone] to set up an appointment to see your surgeon in the office for a follow-up appointment approximately 2-3 weeks after your surgery. Make sure that you call for this appointment the day you arrive home to insure a convenient appointment time. 9. IF YOU HAVE DISABILITY OR FAMILY LEAVE FORMS, BRING THEM TO THE OFFICE FOR PROCESSING.  DO NOT GIVE THEM TO YOUR DOCTOR.   WHEN TO CALL US  [phone]: Poor pain control Reactions / problems with new medications (rash/itching, nausea, etc)  Fever over 101.5 F (38.5 C) Worsening swelling or bruising Continued bleeding from incision. Increased pain, redness, or drainage from the incision Difficulty breathing / swallowing   The clinic staff is available to answer your questions during regular business hours (8:30am-5pm).  Please don't hesitate to call and ask to speak to one of our nurses for clinical concerns.   If you have a medical emergency, go to the nearest emergency room or call 911.  A surgeon from [facility] is always on call at the [facility], [place] [address], [address], [place], [place]  [address] ? MAIN: [phone] ? TOLL FREE: [phone] ?  FAX [phone] [email or web address]

## 2023-12-23 ENCOUNTER — Encounter (HOSPITAL_COMMUNITY): Payer: Self-pay | Admitting: Surgery

## 2023-12-23 LAB — SURGICAL PATHOLOGY

## 2023-12-24 ENCOUNTER — Ambulatory Visit: Payer: Self-pay

## 2024-01-15 ENCOUNTER — Other Ambulatory Visit: Payer: Self-pay | Admitting: Family Medicine

## 2024-02-15 ENCOUNTER — Encounter: Payer: Self-pay | Admitting: Family Medicine

## 2024-02-15 ENCOUNTER — Ambulatory Visit (INDEPENDENT_AMBULATORY_CARE_PROVIDER_SITE_OTHER): Admitting: Family Medicine

## 2024-02-15 VITALS — BP 138/84 | HR 57 | Ht 67.0 in | Wt 168.0 lb

## 2024-02-15 DIAGNOSIS — I10 Essential (primary) hypertension: Secondary | ICD-10-CM | POA: Diagnosis not present

## 2024-02-15 DIAGNOSIS — E782 Mixed hyperlipidemia: Secondary | ICD-10-CM

## 2024-02-15 DIAGNOSIS — E119 Type 2 diabetes mellitus without complications: Secondary | ICD-10-CM

## 2024-02-15 LAB — POCT GLYCOSYLATED HEMOGLOBIN (HGB A1C): Hemoglobin A1C: 6.6 % — AB (ref 4.0–5.6)

## 2024-02-15 MED ORDER — ATORVASTATIN CALCIUM 10 MG PO TABS: 10.0000 mg | ORAL_TABLET | Freq: Every day | ORAL | 1 refills | Status: AC

## 2024-02-15 NOTE — Addendum Note (Signed)
 Addended by: Lucretia Pendley E on: 02/15/2024 08:38 AM   Modules accepted: Orders

## 2024-02-15 NOTE — Progress Notes (Addendum)
 Established Patient Office Visit  Subjective    Patient ID: Bradley Thompson, male    DOB: 11-21-1945  Age: 78 y.o. MRN: [id number]  CC:  Chief Complaint  Patient presents with   Medical Management of Chronic Issues    Pt reports he had a cyst removed off his back, he has one in his hand now. He is wanting to know why they keep coming back. Wondering if it is something he eats possibly.     HPI Bradley Thompson presents for routine follow up hypertension. Patient reports med compliance. Patient reports he has had some lipomas removed from his back since last visit without incidence but that he is developing an new one on his left hand.   Outpatient Encounter Medications as of 02/15/2024  Medication Sig   Flaxseed, Linseed, (FLAX SEEDS PO) Take 1 Dose by mouth 2 (two) times a week.   [DISCONTINUED] atorvastatin  (LIPITOR) 10 MG tablet Take 1 tablet (10 mg total) by mouth daily.   atorvastatin  (LIPITOR) 10 MG tablet Take 1 tablet (10 mg total) by mouth daily.   Ginseng (GIN-ZING PO) Take 1 Dose by mouth daily. (Patient not taking: Reported on 02/15/2024)   pantoprazole  (PROTONIX ) 40 MG tablet Take 1 tablet (40 mg total) by mouth daily. (Patient not taking: Reported on 02/15/2024)   sildenafil  (VIAGRA ) 100 MG tablet TAKE 1/2 - 1 TABLET BY MOUTH DAILY AS NEEDED FOR ERECTILE DYSFUNCTION (Patient not taking: Reported on 02/15/2024)   Facility-Administered Encounter Medications as of 02/15/2024  Medication   0.9 %  sodium chloride  infusion    Past Medical History:  Diagnosis Date   GERD (gastroesophageal reflux disease)    Helicobacter pylori (H. pylori) infection 11/09/2017   History of colon polyps    HYPERLIPIDEMIA 12/21/2008   Hypertension    no meds   life style change    Past Surgical History:  Procedure Laterality Date   COLONOSCOPY     EXCISION MASS, BACK N/A 12/22/2023   Procedure: EXCISION MASS, BACK;  Surgeon: Signe Mitzie LABOR, MD;  Location: [facility];  Service: General;   Laterality: N/A;  PRONE   ROTATOR CUFF REPAIR     Dr. 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 . married '68 - 20  yrs/widowed; serial monogomy. 2 sons - '72, '82:  Oldest son taught in [place] for several years. He lives [place]; younger son lives in [place]; 1 grandson.. retired '07 - worked [employer]. Remains sexually active - uses barrier protection.   Social Drivers of Corporate investment banker Strain: Low Risk  (10/22/2023)   Overall Financial Resource Strain (CARDIA)    Difficulty of Paying Living Expenses: Not hard at all  Food Insecurity: No Food Insecurity (10/22/2023)   Hunger Vital Sign    Worried About Running Out of Food in the Last Year: Never true    Ran Out of Food in the Last Year: Never true  Transportation Needs: No Transportation Needs (10/22/2023)   PRAPARE - Administrator, Civil Service (Medical): No    Lack of Transportation (Non-Medical): No  Physical Activity: Insufficiently Active (10/22/2023)    Exercise Vital Sign    Days of Exercise per Week: 4 days    Minutes of Exercise per Session: 30 min  Stress: No Stress Concern Present (10/22/2023)   [employer] of Occupational Health - Occupational Stress Questionnaire    Feeling of Stress : Not at all  Social Connections: Moderately Isolated (10/22/2023)   Social Connection and Isolation Panel    Frequency of Communication with Friends and Family: More than three times a week    Frequency of Social Gatherings with Friends and Family: More than three times a week    Attends Religious Services: More than 4 times per year    Active Member of [employer] or Organizations: No    Attends Banker Meetings: Never    Marital Status: Widowed  Intimate Partner Violence: Not At Risk (10/22/2023)   Humiliation, Afraid, Rape, and Kick questionnaire    Fear of Current or Ex-Partner: No    Emotionally Abused: No    Physically Abused: No    Sexually Abused: No    Review of Systems  All other systems reviewed and are negative.       Objective    BP 138/84   Pulse (!) 57   Ht 5' 7 (1.702 m)   Wt 168 lb (76.2 kg)   SpO2 98%   BMI 26.31 kg/m   Physical Exam Vitals and nursing note reviewed.  Constitutional:      General: He is not in acute distress. Cardiovascular:     Rate and Rhythm: Normal rate and regular rhythm.  Pulmonary:     Effort: Pulmonary effort is normal.     Breath sounds: Normal breath sounds.  Abdominal:     Palpations: Abdomen is soft.     Tenderness: There is no abdominal tenderness.  Genitourinary:    Penis: Normal and circumcised.      Testes: Normal.  Neurological:     General: No focal deficit present.     Mental Status: He is alert and oriented to person, place, and time.         Assessment & Plan:  1. Essential hypertension (Primary) Appears stable off meds. continue   2. Hyperlipidemia, unspecified hyperlipidemia type Recent lipid shows slight improvement with meds. Continue     3. Diet-controlled diabetes mellitus (HCC) A1c is at goal. Continue  - POCT glycosylated hemoglobin (Hb A1C)   Return in about 6 months (around 08/17/2024) for follow up, chronic med issues.   Tanda Raguel SQUIBB, MD

## 2024-02-15 NOTE — Addendum Note (Signed)
 Addended by: TANDA RAGUEL SQUIBB on: 02/15/2024 09:34 AM   Modules accepted: Level of Service

## 2024-07-10 ENCOUNTER — Other Ambulatory Visit: Payer: Self-pay | Admitting: Family Medicine

## 2024-07-22 ENCOUNTER — Ambulatory Visit: Admission: EM | Admit: 2024-07-22 | Discharge: 2024-07-22 | Discharge status: Against Medical Advice

## 2024-07-22 ENCOUNTER — Ambulatory Visit: Admission: EM | Admit: 2024-07-22 | Discharge: 2024-07-22 | Disposition: A | Discharge status: Home / Self Care

## 2024-07-22 ENCOUNTER — Encounter: Payer: Self-pay | Admitting: Emergency Medicine

## 2024-07-22 ENCOUNTER — Ambulatory Visit (INDEPENDENT_AMBULATORY_CARE_PROVIDER_SITE_OTHER)

## 2024-07-22 DIAGNOSIS — M7989 Other specified soft tissue disorders: Secondary | ICD-10-CM

## 2024-07-22 DIAGNOSIS — M1812 Unilateral primary osteoarthritis of first carpometacarpal joint, left hand: Secondary | ICD-10-CM | POA: Diagnosis not present

## 2024-07-22 MED ORDER — DICLOFENAC SODIUM 75 MG PO TBEC: 75.0000 mg | DELAYED_RELEASE_TABLET | Freq: Two times a day (BID) | ORAL | 0 refills | Status: DC

## 2024-07-22 NOTE — ED Triage Notes (Addendum)
 11am - phone call to pt waiting in car (room is ready)  11:05am - phone call to pt waiting in car (room ready)  11:10 - Call for patient in [place] due to no answer   11:11 - phone call to pt waiting in car for triage  11:23- final call, removed from board

## 2024-07-22 NOTE — ED Triage Notes (Signed)
 Pt presents c/o left thumb injury x 14 days. Pt states,  I have a problem with my thumb. It's swollen. I have tried several home remedies to help with the pain but nothing has been successful. I think I sprung it Pt denies injury and/or fall.

## 2024-07-22 NOTE — ED Provider Notes (Signed)
 " [name]    CSN: [id number] Arrival date & time: 07/22/24  1213      History   Chief Complaint Chief Complaint  Patient presents with   Finger Injury    L thumb     HPI Bradley Thompson is a 78 y.o. male.   Patient presents today due to 14 days worth of swollen and mildly tender left thumb.  Patient states that he believes he may have jammed it about 14 days ago.  Patient received and taken ibuprofen, Tylenol , ice, and turmeric tea for symptoms with +/- relief.    The history is provided by the patient.    Past Medical History:  Diagnosis Date   GERD (gastroesophageal reflux disease)    Helicobacter pylori (H. pylori) infection 11/09/2017   History of colon polyps    HYPERLIPIDEMIA 12/21/2008   Hypertension    no meds   life style change    Patient Active Problem List   Diagnosis Date Noted   Helicobacter pylori (H. pylori) infection 11/09/2017   Lipoma 03/16/2014   Preventative health care 02/26/2011   Hyperlipidemia 12/21/2008   Hx of adenomatous colonic polyps 11/29/2003    Past Surgical History:  Procedure Laterality Date   COLONOSCOPY     EXCISION MASS, BACK N/A 12/22/2023   Procedure: EXCISION MASS, BACK;  Surgeon: Signe Mitzie LABOR, MD;  Location: [facility];  Service: General;  Laterality: N/A;  PRONE   ROTATOR CUFF REPAIR     Dr. Sherrlyn shoulder       Home Medications    Prior to Admission medications  Medication Sig Start Date End Date Taking? Authorizing Provider  ibuprofen (ADVIL) 600 MG tablet Take 600 mg by mouth every 6 (six) hours as needed.   Yes [provider]  atorvastatin  (LIPITOR) 10 MG tablet Take 1 tablet (10 mg total) by mouth daily. 02/15/24   Tanda Bleacher, MD  Flaxseed, Linseed, (FLAX SEEDS PO) Take 1 Dose by mouth 2 (two) times a week.    [provider]  Ginseng (GIN-ZING PO) Take 1 Dose by mouth daily. Patient not taking: Reported on 02/15/2024    [provider]  pantoprazole   (PROTONIX ) 40 MG tablet Take 1 tablet (40 mg total) by mouth daily. Patient not taking: Reported on 02/15/2024 12/01/23   Mayers, Cari S, PA-C  sildenafil  (VIAGRA ) 100 MG tablet TAKE 1/2 - 1 TABLET BY MOUTH DAILY AS NEEDED FOR ERECTILE DYSFUNCTION 07/11/24   Tanda Bleacher, MD    Family History Family History  Problem Relation Age of Onset   Colon cancer Neg Hx    Esophageal cancer Neg Hx    Liver cancer Neg Hx    Pancreatic cancer Neg Hx    Rectal cancer Neg Hx    Stomach cancer Neg Hx     Social History Social History[1]   Allergies   Patient has no known allergies.   Review of Systems Review of Systems   Physical Exam Triage Vital Signs ED Triage Vitals  Encounter Vitals Group     BP 07/22/24 1353 (!) 149/80     Girls Systolic BP Percentile --      Girls Diastolic BP Percentile --      Boys Systolic BP Percentile --      Boys Diastolic BP Percentile --      Pulse Rate 07/22/24 1353 64     Resp 07/22/24 1353 18     Temp 07/22/24 1353 97.9 F (36.6 C)  Temp Source 07/22/24 1353 Oral     SpO2 07/22/24 1353 97 %     Weight 07/22/24 1346 167 lb 15.9 oz (76.2 kg)     Height --      Head Circumference --      Peak Flow --      Pain Score 07/22/24 1346 8     Pain Loc --      Pain Education --      Exclude from Growth Chart --    No data found.  Updated Vital Signs BP (!) 149/80 (BP Location: Left Arm)   Pulse 64   Temp 97.9 F (36.6 C) (Oral)   Resp 18   Wt 167 lb 15.9 oz (76.2 kg)   SpO2 97%   BMI 26.31 kg/m   Visual Acuity Right Eye Distance:   Left Eye Distance:   Bilateral Distance:    Right Eye Near:   Left Eye Near:    Bilateral Near:     Physical Exam Vitals and nursing note reviewed.  Constitutional:      General: He is not in acute distress.    Appearance: Normal appearance. He is not ill-appearing, toxic-appearing or diaphoretic.  Eyes:     General: No scleral icterus. Cardiovascular:     Rate and Rhythm: Normal rate and regular  rhythm.     Heart sounds: Normal heart sounds.  Pulmonary:     Effort: Pulmonary effort is normal. No respiratory distress.     Breath sounds: Normal breath sounds. No wheezing or rhonchi.  Musculoskeletal:     Left hand: Swelling and tenderness present.     Comments: Moderate edema and mild tenderness to palpation of left first MCP joint   Skin:    General: Skin is warm.  Neurological:     Mental Status: He is alert and oriented to person, place, and time.  Psychiatric:        Mood and Affect: Mood normal.        Behavior: Behavior normal.      UC Treatments / Results  Labs (all labs ordered are listed, but only abnormal results are displayed) Labs Reviewed - No data to display  EKG   Radiology No results found.  Procedures Procedures (including critical care time)  Medications Ordered in UC Medications - No data to display  Initial Impression / Assessment and Plan / UC Course  I have reviewed the triage vital signs and the nursing notes.  Pertinent labs & imaging results that were available during my care of the patient were reviewed by me and considered in my medical decision making (see chart for details).     Final Clinical Impressions(s) / UC Diagnoses   Final diagnoses:  Swelling of thumb, left   Discharge Instructions   None    ED Prescriptions   None    PDMP not reviewed this encounter.    [1]  Social History Tobacco Use   Smoking status: Never    Passive exposure: Yes   Smokeless tobacco: Never  Vaping Use   Vaping status: Never Used  Substance Use Topics   Alcohol use: Yes    Alcohol/week: 4.0 standard drinks of alcohol    Types: 1 Cans of beer, 3 Standard drinks or equivalent per week    Comment: ocassional   Drug use: Not Currently    Types: Marijuana, Other-see comments     Andra Corean BROCKS, PA-C 07/22/24 1527  "

## 2024-08-16 ENCOUNTER — Encounter: Payer: Self-pay | Admitting: Family Medicine

## 2024-08-16 ENCOUNTER — Ambulatory Visit: Admitting: Family Medicine

## 2024-08-16 VITALS — BP 115/78 | HR 93 | Ht 67.0 in | Wt 164.0 lb

## 2024-08-16 DIAGNOSIS — I10 Essential (primary) hypertension: Secondary | ICD-10-CM | POA: Diagnosis not present

## 2024-08-16 DIAGNOSIS — R21 Rash and other nonspecific skin eruption: Secondary | ICD-10-CM | POA: Diagnosis not present

## 2024-08-16 DIAGNOSIS — E782 Mixed hyperlipidemia: Secondary | ICD-10-CM

## 2024-08-16 MED ORDER — TRIAMCINOLONE ACETONIDE 0.1 % EX CREA: 1.0000 | TOPICAL_CREAM | Freq: Two times a day (BID) | CUTANEOUS | 0 refills | Status: AC

## 2024-08-16 MED ORDER — DICLOFENAC SODIUM 75 MG PO TBEC: 75.0000 mg | DELAYED_RELEASE_TABLET | Freq: Two times a day (BID) | ORAL | 0 refills | Status: AC

## 2024-08-16 NOTE — Progress Notes (Signed)
 "  Established Patient Office Visit  Subjective    Patient ID: Bradley Thompson, male    DOB: 04/22/1946  Age: 79 y.o. MRN: [id number]  CC:  Chief Complaint  Patient presents with   Medical Management of Chronic Issues    Pt here for 6 month follow up. Also reports eczema on hand    HPI Bradley Thompson presents for routine follow up of hypertension. Patient reports med compliance. He also reports a rash on his right hand. Patient denies known contacts but does report that he uses bleach to wash dishes without gloves.   Outpatient Encounter Medications as of 08/16/2024  Medication Sig   amoxicillin (AMOXIL) 875 MG tablet Take 875 mg by mouth 2 (two) times daily.   atorvastatin  (LIPITOR) 10 MG tablet Take 1 tablet (10 mg total) by mouth daily.   Flaxseed, Linseed, (FLAX SEEDS PO) Take 1 Dose by mouth 2 (two) times a week.   ibuprofen (ADVIL) 600 MG tablet Take 600 mg by mouth every 6 (six) hours as needed.   sildenafil  (VIAGRA ) 100 MG tablet TAKE 1/2 - 1 TABLET BY MOUTH DAILY AS NEEDED FOR ERECTILE DYSFUNCTION   triamcinolone  cream (KENALOG ) 0.1 % Apply 1 Application topically 2 (two) times daily.   diclofenac  (VOLTAREN ) 75 MG EC tablet Take 1 tablet (75 mg total) by mouth 2 (two) times daily.   [DISCONTINUED] diclofenac  (VOLTAREN ) 75 MG EC tablet Take 1 tablet (75 mg total) by mouth 2 (two) times daily.   Facility-Administered Encounter Medications as of 08/16/2024  Medication   0.9 %  sodium chloride  infusion    Past Medical History:  Diagnosis Date   GERD (gastroesophageal reflux disease)    Helicobacter pylori (H. pylori) infection 11/09/2017   History of colon polyps    HYPERLIPIDEMIA 12/21/2008   Hypertension    no meds   life style change    Past Surgical History:  Procedure Laterality Date   COLONOSCOPY     EXCISION MASS, BACK N/A 12/22/2023   Procedure: EXCISION MASS, BACK;  Surgeon: Signe Mitzie LABOR, MD;  Location: [facility];  Service: General;  Laterality: N/A;  PRONE    ROTATOR CUFF REPAIR     Dr. 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 . married '  68 - 20 yrs/widowed; serial monogomy. 2 sons - '72, '82:  Oldest son taught in [place] for several years. He lives [place]; younger son lives in [place]; 1 grandson.. retired '07 - worked [employer]. Remains sexually active - uses barrier protection.   Social Drivers of Health   Tobacco Use: Medium Risk (08/16/2024)   Patient History    Smoking Tobacco Use: Never    Smokeless Tobacco Use: Never    Passive Exposure: Yes  Financial Resource Strain: Low Risk (10/22/2023)   Overall Financial Resource Strain (CARDIA)    Difficulty of Paying Living Expenses: Not hard at all  Food Insecurity: No Food Insecurity (10/22/2023)   Hunger Vital Sign    Worried About Running Out of Food in the Last Year: Never true    Ran Out of Food in the Last Year: Never true  Transportation Needs: No Transportation Needs (10/22/2023)   PRAPARE - Scientist, Research (physical Sciences) (Medical): No    Lack of Transportation (Non-Medical): No  Physical Activity: Insufficiently Active (10/22/2023)   Exercise Vital Sign    Days of Exercise per Week: 4 days    Minutes of Exercise per Session: 30 min  Stress: No Stress Concern Present (10/22/2023)   [employer] of Occupational Health - Occupational Stress Questionnaire    Feeling of Stress : Not at all  Social Connections: Moderately Isolated (10/22/2023)   Social Connection and Isolation Panel    Frequency of Communication with Friends and Family: More than three times a week    Frequency of Social Gatherings with Friends and Family: More than three times a week    Attends Religious Services: More than 4 times per year    Active Member of [employer] or Organizations: No    Attends Banker Meetings: Never    Marital Status: Widowed  Intimate Partner Violence: Not At Risk (10/22/2023)   Humiliation, Afraid, Rape, and Kick questionnaire    Fear of Current or Ex-Partner: No    Emotionally Abused: No    Physically Abused: No    Sexually Abused: No  Depression (PHQ2-9): Medium Risk (12/01/2023)   Depression (PHQ2-9)    PHQ-2 Score: 6  Alcohol Screen: Low Risk (10/22/2023)   Alcohol Screen    Last Alcohol Screening Score (AUDIT): 3  Housing: Unknown (12/11/2023)   Received from [facility]   Epic    Unable to Pay for Housing in the Last Year: Not on file    Number of Times Moved in the Last Year: Not on file    At any time in the past 12 months, were you homeless or living in a shelter (including now)?: No  Utilities: Not At Risk (10/22/2023)   [facility] Utilities    Threatened with loss of utilities: No  Health Literacy: Adequate Health Literacy (10/22/2023)   B1300 Health Literacy    Frequency of need for help with medical instructions: Never    Review of Systems  Skin:  Positive for rash. Negative for itching.  All other systems reviewed and are negative.       Objective     BP 115/78   Pulse 93   Ht 5' 7 (1.702 m)   Wt 164 lb (74.4 kg)   SpO2 96%   BMI 25.69 kg/m   Physical Exam Vitals and nursing note reviewed.  Constitutional:      General: He is not in acute distress. Cardiovascular:     Rate and Rhythm: Normal rate  and regular rhythm.  Pulmonary:     Effort: Pulmonary effort is normal.     Breath sounds: Normal breath sounds.  Abdominal:     Palpations: Abdomen is soft.     Tenderness: There is no abdominal tenderness.  Skin:    Findings: Rash (right hand) present.  Neurological:     General: No focal deficit present.     Mental Status: He is alert and oriented to person, place, and time.         Assessment & Plan:   Essential hypertension  Mixed hyperlipidemia  Rash and nonspecific skin eruption  Other orders -     Diclofenac  Sodium; Take 1 tablet (75 mg total) by mouth 2 (two) times daily.  Dispense: 30 tablet; Refill: 0 -     Triamcinolone  Acetonide; Apply 1 Application topically 2 (two) times daily.  Dispense: 80 g; Refill: 0     Return in about 6 months (around 02/13/2025) for follow up, physical.   Tanda Raguel SQUIBB, MD  "

## 2024-08-17 ENCOUNTER — Ambulatory Visit: Admitting: Family Medicine

## 2024-09-01 ENCOUNTER — Ambulatory Visit: Payer: Self-pay

## 2024-09-01 NOTE — Telephone Encounter (Signed)
 FYI Only or Action Required?: Action required by provider: clinical question for provider. Pt requesting acid reflux medication sent to [facility], [address] [place], [place] [address]  Patient was last seen in primary care on 08/16/2024 by Tanda Bleacher, MD.  Called Nurse Triage reporting Abdominal Pain.  Triage Disposition: Go to ED Now (Notify PCP)  Patient/caregiver understands and will follow disposition?: No  Reason for Triage: patient has severe stomach     Reason for Disposition  [1] SEVERE pain AND [2] age > 60 years  Answer Assessment - Initial Assessment Questions 1. LOCATION: Where does it hurt?      Abd pain, 9/10,  Advised ED. Pt states that he does not have a ride to the ED and refuses to call EMS. Pt requesting same thing she gave me last time I had acid reflux. Pt would like medication sent to: [facility], [address] [place], [place] [address]  Protocols used: Abdominal Pain - Male-A-AH

## 2024-09-02 ENCOUNTER — Other Ambulatory Visit: Payer: Self-pay | Admitting: Family Medicine

## 2024-09-02 ENCOUNTER — Ambulatory Visit
Admission: RE | Admit: 2024-09-02 | Discharge: 2024-09-02 | Disposition: A | Discharge status: Home / Self Care | Source: Home / Self Care | Attending: Family Medicine | Admitting: Family Medicine

## 2024-09-02 VITALS — BP 116/83 | HR 85 | Temp 98.2°F | Resp 16 | Wt 160.0 lb

## 2024-09-02 DIAGNOSIS — R1013 Epigastric pain: Secondary | ICD-10-CM

## 2024-09-02 MED ORDER — LIDOCAINE VISCOUS HCL 2 % MT SOLN
15.0000 mL | Freq: Once | OROMUCOSAL | Status: AC
  Administered 2024-09-02: 15 mL via OROMUCOSAL

## 2024-09-02 MED ORDER — PANTOPRAZOLE SODIUM 40 MG PO TBEC: 40.0000 mg | DELAYED_RELEASE_TABLET | Freq: Every day | ORAL | 0 refills | Status: AC

## 2024-09-02 MED ORDER — ALUM & MAG HYDROXIDE-SIMETH 200-200-20 MG/5ML PO SUSP
30.0000 mL | Freq: Once | ORAL | Status: AC
  Administered 2024-09-02: 30 mL via ORAL

## 2024-09-02 NOTE — Discharge Instructions (Signed)
 You were seen today for abdominal pain, likely due to reflux.  I am glad you feel better with the medication I gave you today.  I have sent out an oral daily medication called protonix  to your pharmacy for the next 30 days. Please follow up with your primary care provider about further discussion of this issue, especially if this keeps occurring.  If you have worsening pain despite medication, then please go to the Emergency room for evaluation.  If you run out of this medication, and feel you need over the counter medication, prilosec (omeprazole ) is a good alternative.

## 2024-09-02 NOTE — ED Provider Notes (Signed)
 " [facility]    CSN: [id number] Arrival date & time: 09/02/24  1431      History   Chief Complaint Chief Complaint  Patient presents with   Abdominal Pain    acid reflux - Entered by patient    HPI Bradley Thompson is a 79 y.o. male.    Abdominal Pain Associated symptoms: no constipation, no diarrhea and no vomiting     Patient is here for abdominal pain. It has been hurting for about a week.   Pain is right above the belly button.  He will burp and feels it coming up.  No n/v.  No diarrhea or constipation.  No blood in his stool or dark tarry stools.  He has been taking otc medication, pepto for upset stomach, but not helping.  He is not taking any ppi for gerd at this time.  Denies chest pain or sob.  He was in [place] when this started.  Admits to drinking a bit more as a result.    Per chart review, he did call his pcp yesterday for this pain, and was advised to go to the ER.  He refused to go to the ER, refused to call EMS, and requested medication for heart burn as this was similar to pain he had in the past.  In May 2025 he was seen by his pcp with similar pain, and started on protonix .  He states this was helpful for his pain at that time.  He ran out and does not have this currently.        Past Medical History:  Diagnosis Date   GERD (gastroesophageal reflux disease)    Helicobacter pylori (H. pylori) infection 11/09/2017   History of colon polyps    HYPERLIPIDEMIA 12/21/2008   Hypertension    no meds   life style change    Patient Active Problem List   Diagnosis Date Noted   Helicobacter pylori (H. pylori) infection 11/09/2017   Lipoma 03/16/2014   Preventative health care 02/26/2011   Hyperlipidemia 12/21/2008   Hx of adenomatous colonic polyps 11/29/2003    Past Surgical History:  Procedure Laterality Date   COLONOSCOPY     EXCISION MASS, BACK N/A 12/22/2023   Procedure: EXCISION MASS, BACK;  Surgeon: Signe Mitzie LABOR, MD;   Location: [facility];  Service: General;  Laterality: N/A;  PRONE   ROTATOR CUFF REPAIR     Dr. Sherrlyn shoulder       Home Medications    Prior to Admission medications  Medication Sig Start Date End Date Taking? Authorizing Provider  amoxicillin (AMOXIL) 875 MG tablet Take 875 mg by mouth 2 (two) times daily. 08/08/24   [provider]  atorvastatin  (LIPITOR) 10 MG tablet Take 1 tablet (10 mg total) by mouth daily. 02/15/24   Tanda Bleacher, MD  diclofenac  (VOLTAREN ) 75 MG EC tablet Take 1 tablet (75 mg total) by mouth 2 (two) times daily. 08/16/24   Tanda Bleacher, MD  Flaxseed, Linseed, (FLAX SEEDS PO) Take 1 Dose by mouth 2 (two) times a week.    [provider]  ibuprofen (ADVIL) 600 MG tablet Take 600 mg by mouth every 6 (six) hours as needed.    [provider]  sildenafil  (VIAGRA ) 100 MG tablet TAKE 1/2 - 1 TABLET BY MOUTH DAILY AS NEEDED FOR ERECTILE DYSFUNCTION 07/11/24   Tanda Bleacher, MD  triamcinolone  cream (KENALOG ) 0.1 % Apply 1 Application topically 2 (two) times daily. 08/16/24   Tanda,  Raguel, MD    Family History Family History  Problem Relation Age of Onset   Colon cancer Neg Hx    Esophageal cancer Neg Hx    Liver cancer Neg Hx    Pancreatic cancer Neg Hx    Rectal cancer Neg Hx    Stomach cancer Neg Hx     Social History Social History[1]   Allergies   Patient has no known allergies.   Review of Systems Review of Systems  Constitutional: Negative.   HENT: Negative.    Respiratory: Negative.    Cardiovascular: Negative.   Gastrointestinal:  Positive for abdominal pain. Negative for blood in stool, constipation, diarrhea and vomiting.  Musculoskeletal: Negative.   Skin: Negative.   Psychiatric/Behavioral: Negative.       Physical Exam Triage Vital Signs ED Triage Vitals  Encounter Vitals Group     BP      Girls Systolic BP Percentile      Girls Diastolic BP Percentile      Boys Systolic BP Percentile      Boys  Diastolic BP Percentile      Pulse      Resp      Temp      Temp src      SpO2      Weight      Height      Head Circumference      Peak Flow      Pain Score      Pain Loc      Pain Education      Exclude from Growth Chart    No data found.  Updated Vital Signs There were no vitals taken for this visit.  Visual Acuity Right Eye Distance:   Left Eye Distance:   Bilateral Distance:    Right Eye Near:   Left Eye Near:    Bilateral Near:     Physical Exam Constitutional:      General: He is not in acute distress.    Appearance: He is well-developed and normal weight. He is not ill-appearing or toxic-appearing.  Cardiovascular:     Rate and Rhythm: Normal rate and regular rhythm.  Pulmonary:     Effort: Pulmonary effort is normal.     Breath sounds: Normal breath sounds.  Abdominal:     General: Abdomen is flat. Bowel sounds are normal.     Palpations: Abdomen is soft.     Tenderness: There is abdominal tenderness in the epigastric area. There is no guarding or rebound. Negative signs include Murphy's sign and McBurney's sign.  Neurological:     General: No focal deficit present.     Mental Status: He is alert.  Psychiatric:        Mood and Affect: Mood normal.      UC Treatments / Results  Labs (all labs ordered are listed, but only abnormal results are displayed) Labs Reviewed - No data to display  EKG   Radiology No results found.  Procedures Procedures (including critical care time)  Medications Ordered in UC Medications  alum & mag hydroxide-simeth (MAALOX/MYLANTA) 200-200-20 MG/5ML suspension 30 mL (has no administration in time range)  lidocaine  (XYLOCAINE ) 2 % viscous mouth solution 15 mL (has no administration in time range)    Initial Impression / Assessment and Plan / UC Course  I have reviewed the triage vital signs and the nursing notes.  Pertinent labs & imaging results that were available during my care of the patient were reviewed  by me and considered in my medical decision making (see chart for details).    Final Clinical Impressions(s) / UC Diagnoses   Final diagnoses:  Epigastric pain     Discharge Instructions      You were seen today for abdominal pain, likely due to reflux.  I am glad you feel better with the medication I gave you today.  I have sent out an oral daily medication called protonix  to your pharmacy for the next 30 days. Please follow up with your primary care provider about further discussion of this issue, especially if this keeps occurring.  If you have worsening pain despite medication, then please go to the Emergency room for evaluation.  If you run out of this medication, and feel you need over the counter medication, prilosec (omeprazole ) is a good alternative.      ED Prescriptions     Medication Sig Dispense Auth. Provider   pantoprazole  (PROTONIX ) 40 MG tablet Take 1 tablet (40 mg total) by mouth daily. 30 tablet Darral Longs, MD      PDMP not reviewed this encounter.      [1]  Social History Tobacco Use   Smoking status: Never    Passive exposure: Yes   Smokeless tobacco: Never  Vaping Use   Vaping status: Never Used  Substance Use Topics   Alcohol use: Yes    Alcohol/week: 4.0 standard drinks of alcohol    Types: 1 Cans of beer, 3 Standard drinks or equivalent per week    Comment: ocassional   Drug use: Not Currently    Types: Marijuana, Other-see comments     Darral Longs, MD 09/02/24 1522  "

## 2024-09-02 NOTE — ED Triage Notes (Signed)
 Pt c/o epigastric pain and acid reflux x 1 week. Pt states he noticed his symptoms started after eating steak and eggs. He has tried several OTC antacid medications with no relief.

## 2024-11-03 ENCOUNTER — Ambulatory Visit

## 2025-02-14 ENCOUNTER — Encounter: Payer: Self-pay | Admitting: Family Medicine
